# Patient Record
Sex: Male | Born: 2000 | Race: Black or African American | Hispanic: No | Marital: Single | Smoking: Current some day smoker
Health system: Southern US, Community
[De-identification: ages and names within clinical notes are randomized; demographics above are authoritative.]

## PROBLEM LIST (undated history)

## (undated) ENCOUNTER — Ambulatory Visit (HOSPITAL_COMMUNITY): Admission: EM | Source: Home / Self Care

## (undated) DIAGNOSIS — G43909 Migraine, unspecified, not intractable, without status migrainosus: Secondary | ICD-10-CM

## (undated) DIAGNOSIS — J302 Other seasonal allergic rhinitis: Secondary | ICD-10-CM

## (undated) HISTORY — PX: WISDOM TOOTH EXTRACTION: SHX21

## (undated) HISTORY — PX: ANKLE SURGERY: SHX546

---
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
## 2020-01-26 IMAGING — DX DG ANKLE COMPLETE 3+V*R*
3 series · 3 of 3 positions shown · non-contrast
Comparison: Right foot 08/17/2018

CLINICAL DATA: Basketball injury.  Right ankle swelling.

EXAM:
RIGHT ANKLE - COMPLETE 3+ VIEW

[ankle ap]
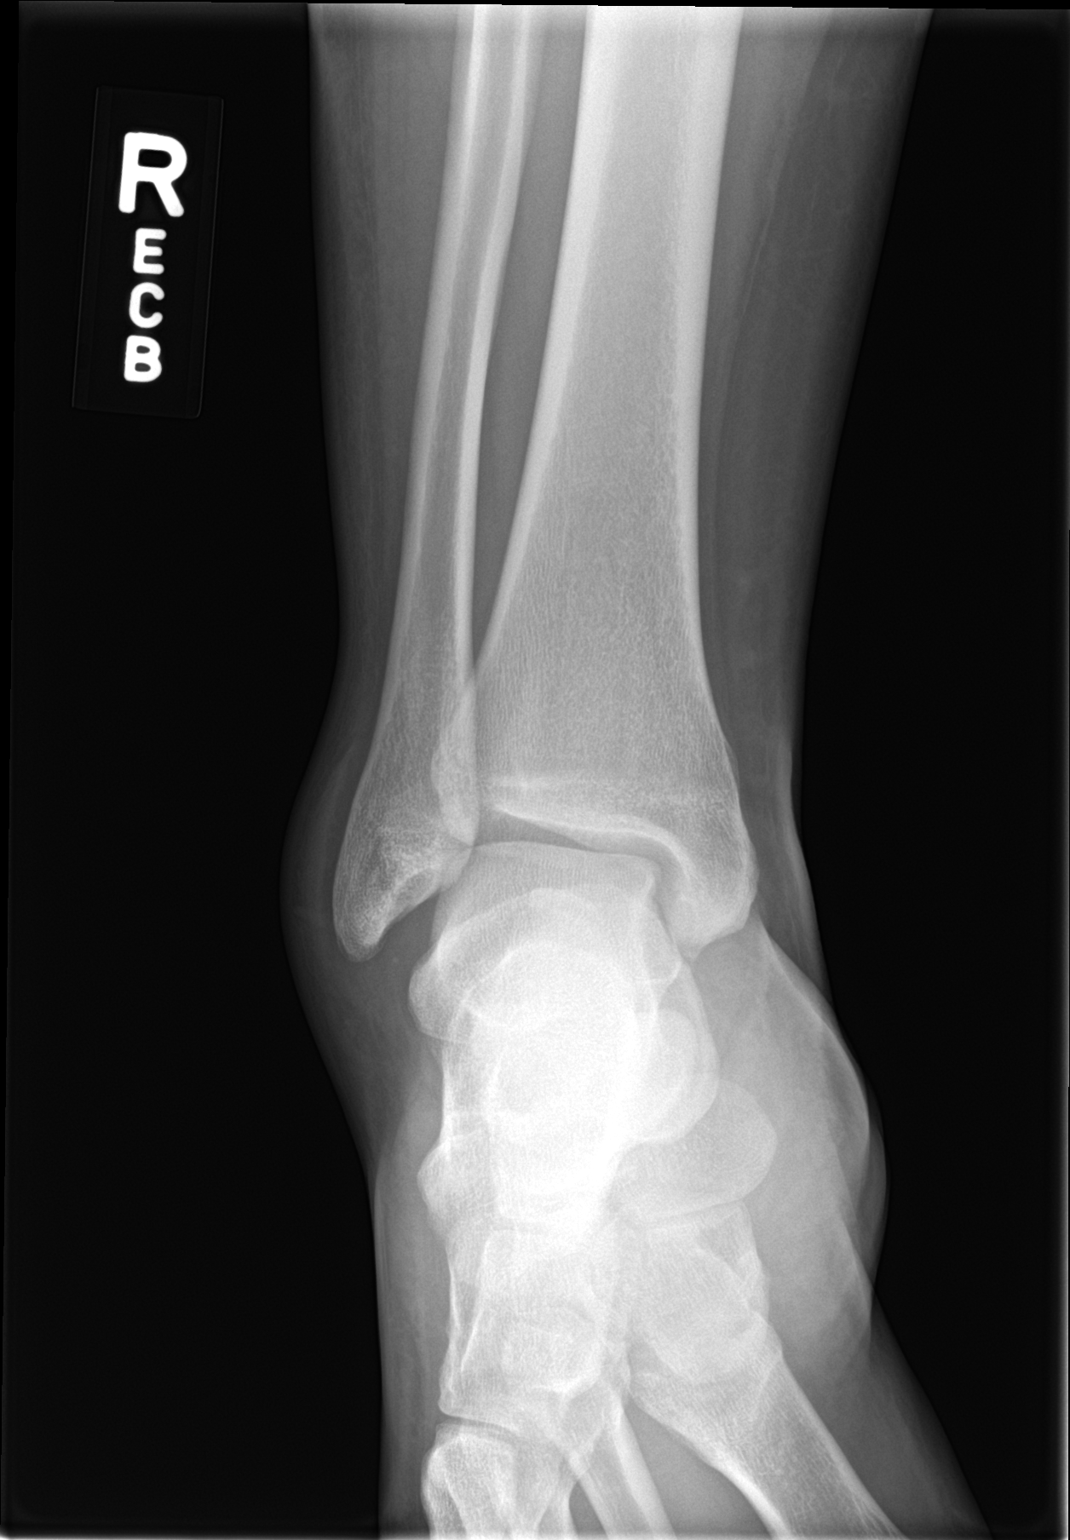

[ankle obl]
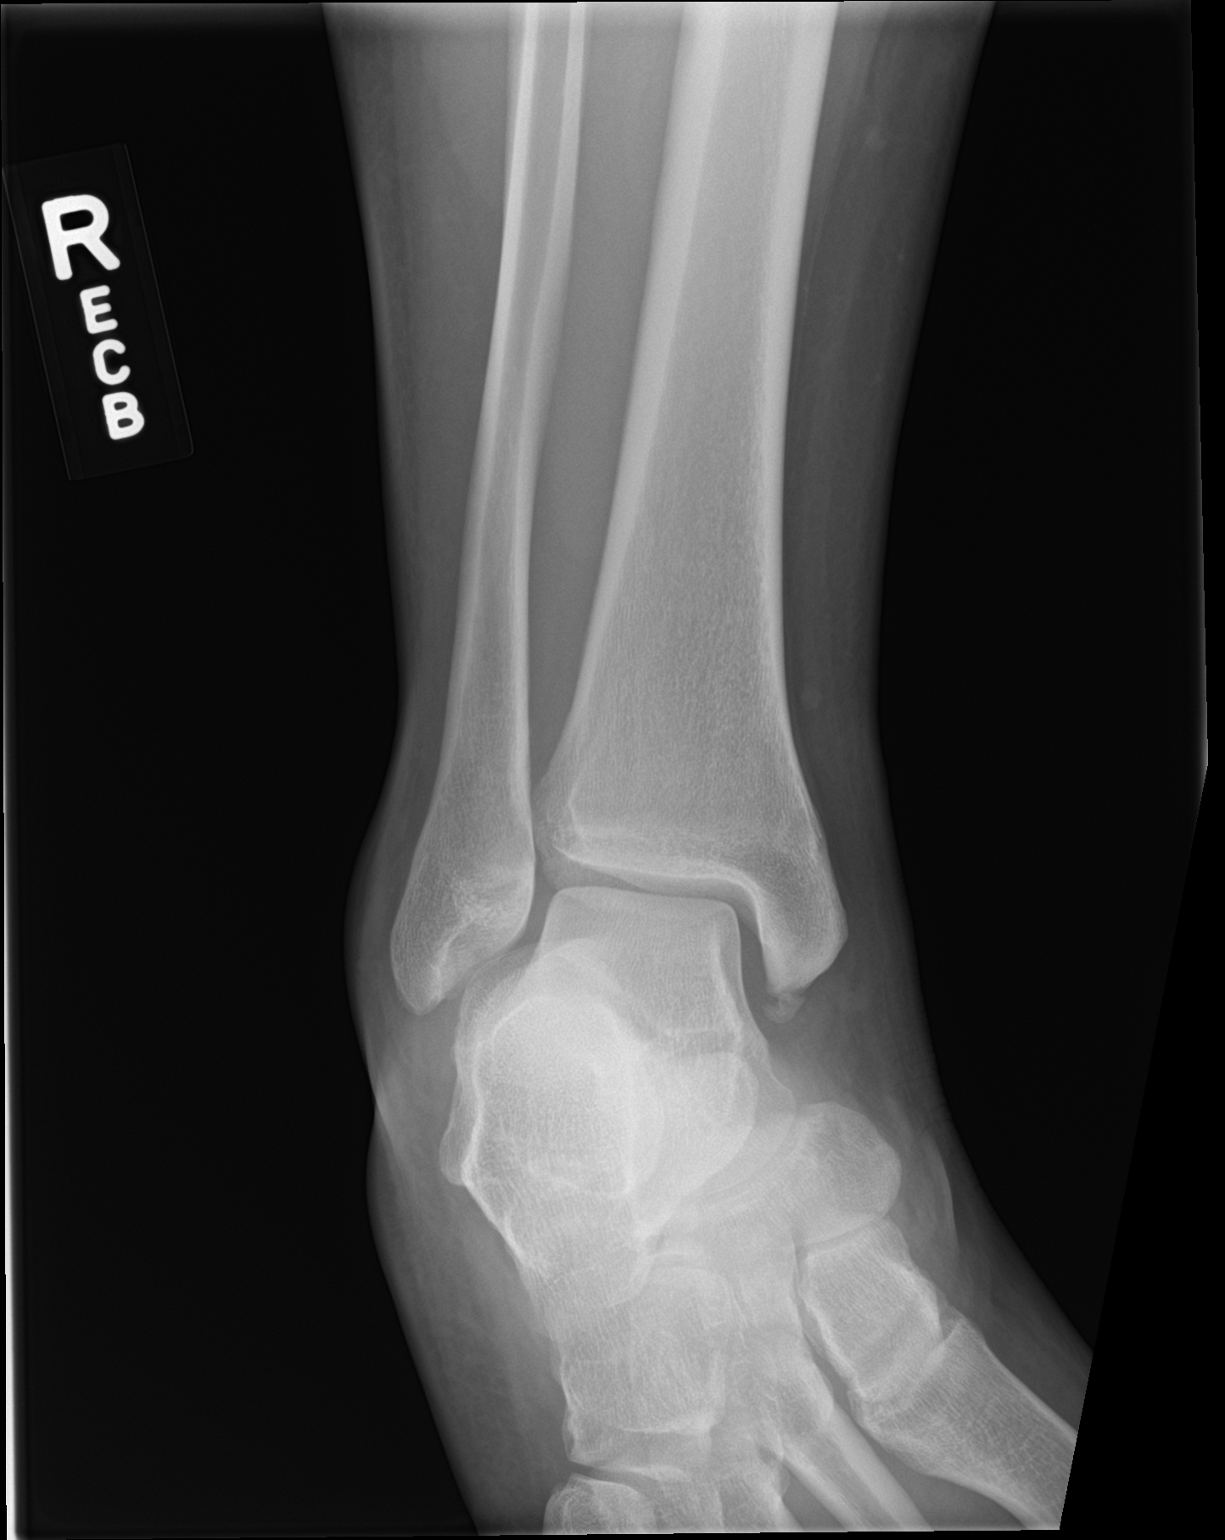

[ankle lat]
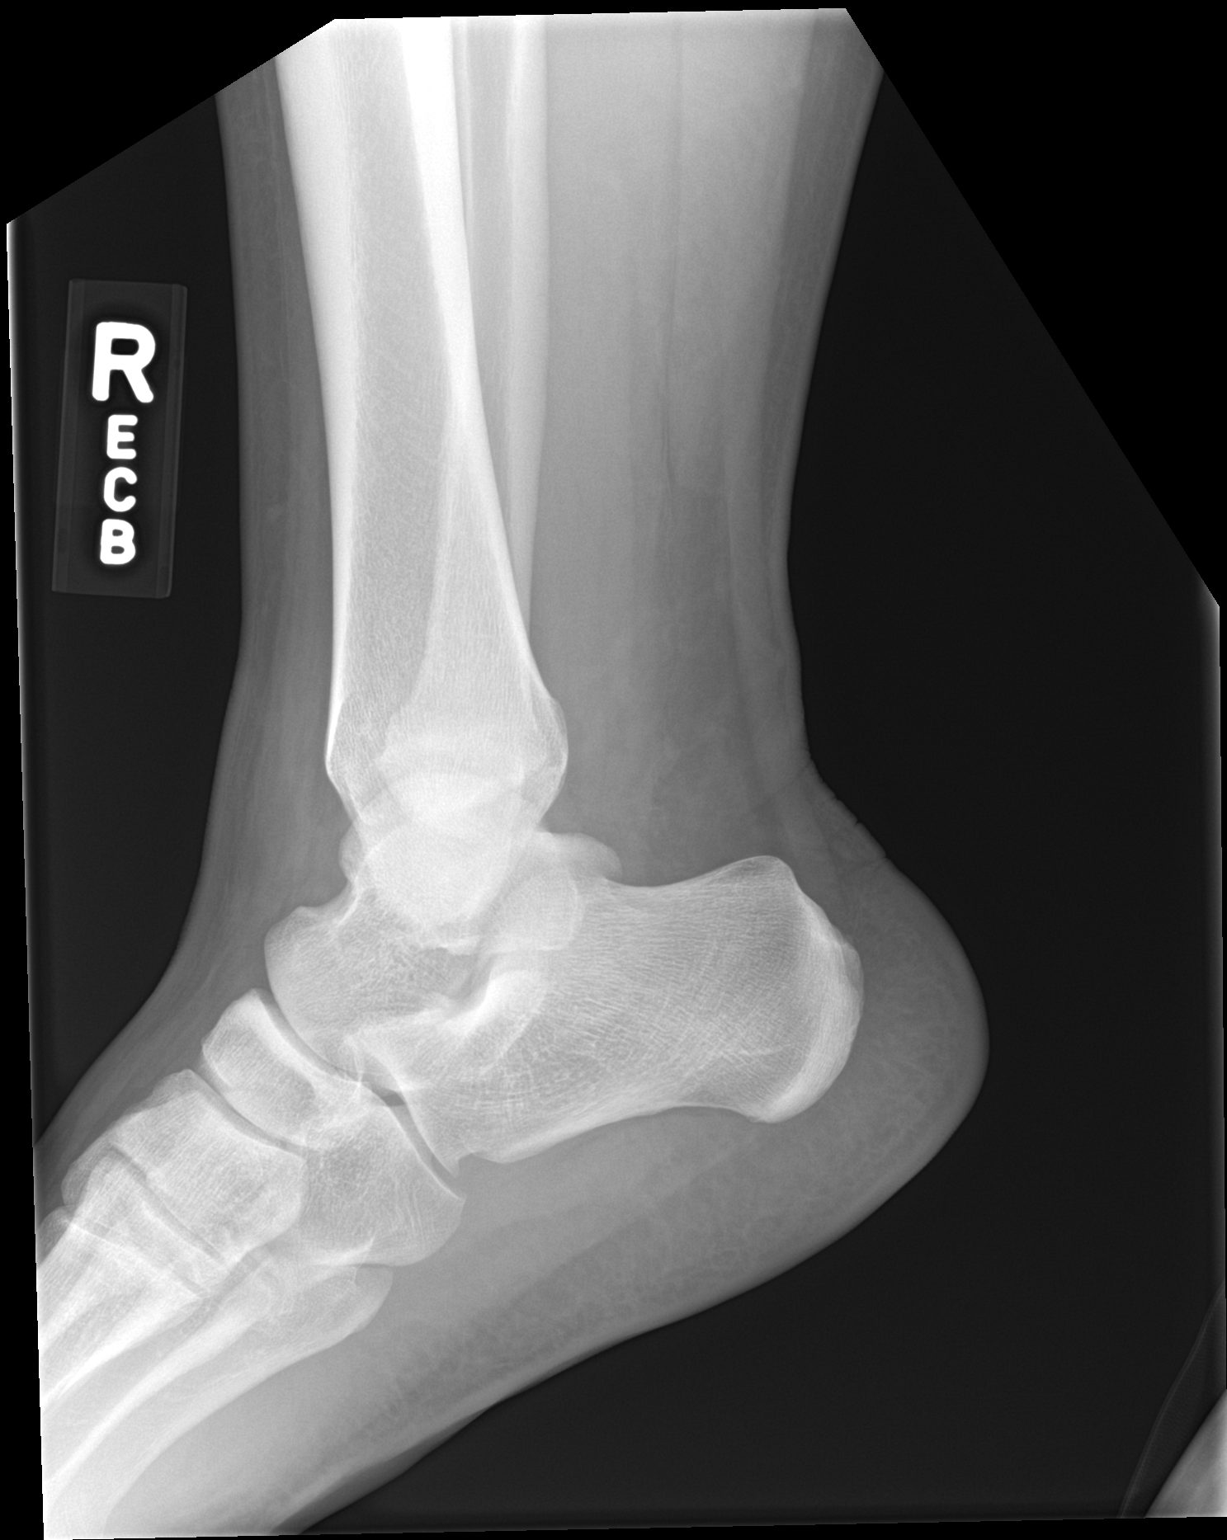

[3 of 3 positions shown; findings below may reference images not displayed]

FINDINGS: Right ankle is located. There is a bone fragment along the distal
aspect of the medial malleolus compatible with an avulsion injury.
There is soft tissue swelling in the right ankle.
IMPRESSION: Avulsion fracture involving the medial malleolus.

## 2020-03-12 ENCOUNTER — Emergency Department (HOSPITAL_COMMUNITY)
Admission: EM | Admit: 2020-03-12 | Discharge: 2020-03-13 | Disposition: A | Discharge status: Home / Self Care | Payer: Medicaid Other | Attending: Emergency Medicine | Admitting: Emergency Medicine

## 2020-03-12 ENCOUNTER — Emergency Department (HOSPITAL_COMMUNITY): Payer: Medicaid Other

## 2020-03-12 ENCOUNTER — Encounter (HOSPITAL_COMMUNITY): Payer: Self-pay

## 2020-03-12 DIAGNOSIS — R0602 Shortness of breath: Secondary | ICD-10-CM | POA: Insufficient documentation

## 2020-03-12 DIAGNOSIS — R61 Generalized hyperhidrosis: Secondary | ICD-10-CM | POA: Diagnosis not present

## 2020-03-12 DIAGNOSIS — Z5321 Procedure and treatment not carried out due to patient leaving prior to being seen by health care provider: Secondary | ICD-10-CM | POA: Insufficient documentation

## 2020-03-12 DIAGNOSIS — R0789 Other chest pain: Secondary | ICD-10-CM | POA: Diagnosis present

## 2020-03-12 LAB — CBC
HCT: 39.9 % (ref 39.0–52.0)
Hemoglobin: 13.4 g/dL (ref 13.0–17.0)
MCH: 28.8 pg (ref 26.0–34.0)
MCHC: 33.6 g/dL (ref 30.0–36.0)
MCV: 85.6 fL (ref 80.0–100.0)
Platelets: 215 10*3/uL (ref 150–400)
RBC: 4.66 MIL/uL (ref 4.22–5.81)
RDW: 13.1 % (ref 11.5–15.5)
WBC: 6.1 10*3/uL (ref 4.0–10.5)
nRBC: 0 % (ref 0.0–0.2)

## 2020-03-12 LAB — BASIC METABOLIC PANEL
Anion gap: 8 (ref 5–15)
BUN: 9 mg/dL (ref 6–20)
CO2: 28 mmol/L (ref 22–32)
Calcium: 9.5 mg/dL (ref 8.9–10.3)
Chloride: 103 mmol/L (ref 98–111)
Creatinine, Ser: 0.92 mg/dL (ref 0.61–1.24)
GFR calc Af Amer: >60 mL/min (ref >60)
GFR calc non Af Amer: >60 mL/min (ref >60)
Glucose, Bld: 96 mg/dL (ref 70–99)
Potassium: 4.2 mmol/L (ref 3.5–5.1)
Sodium: 139 mmol/L (ref 135–145)

## 2020-03-12 LAB — TROPONIN I (HIGH SENSITIVITY): Troponin I (High Sensitivity): 5 ng/L (ref <18)

## 2020-03-12 NOTE — ED Notes (Signed)
Pt di not want to wait.LWBS

## 2020-03-12 NOTE — ED Triage Notes (Signed)
Pt arrives POV for eval of episodic L sided chest pain described as squeezing and tight in nature. Endorses SOB and diaphoresis w/ pain. Pt reports + ETOH and MJ use today

## 2020-11-13 ENCOUNTER — Emergency Department (HOSPITAL_COMMUNITY): Payer: Medicaid Other

## 2020-11-13 ENCOUNTER — Encounter (HOSPITAL_COMMUNITY): Payer: Self-pay

## 2020-11-13 ENCOUNTER — Emergency Department (HOSPITAL_COMMUNITY)
Admission: EM | Admit: 2020-11-13 | Discharge: 2020-11-13 | Disposition: A | Discharge status: Home / Self Care | Payer: Medicaid Other | Attending: Emergency Medicine | Admitting: Emergency Medicine

## 2020-11-13 DIAGNOSIS — R079 Chest pain, unspecified: Secondary | ICD-10-CM | POA: Insufficient documentation

## 2020-11-13 DIAGNOSIS — Z5321 Procedure and treatment not carried out due to patient leaving prior to being seen by health care provider: Secondary | ICD-10-CM | POA: Insufficient documentation

## 2020-11-13 LAB — BASIC METABOLIC PANEL
Anion gap: 6 (ref 5–15)
BUN: 9 mg/dL (ref 6–20)
CO2: 27 mmol/L (ref 22–32)
Calcium: 9.8 mg/dL (ref 8.9–10.3)
Chloride: 104 mmol/L (ref 98–111)
Creatinine, Ser: 0.96 mg/dL (ref 0.61–1.24)
GFR, Estimated: >60 mL/min (ref >60)
Glucose, Bld: 97 mg/dL (ref 70–99)
Potassium: 4 mmol/L (ref 3.5–5.1)
Sodium: 137 mmol/L (ref 135–145)

## 2020-11-13 LAB — CBC
HCT: 45 % (ref 39.0–52.0)
Hemoglobin: 15.4 g/dL (ref 13.0–17.0)
MCH: 29.4 pg (ref 26.0–34.0)
MCHC: 34.2 g/dL (ref 30.0–36.0)
MCV: 85.9 fL (ref 80.0–100.0)
Platelets: 227 10*3/uL (ref 150–400)
RBC: 5.24 MIL/uL (ref 4.22–5.81)
RDW: 13.6 % (ref 11.5–15.5)
WBC: 4.1 10*3/uL (ref 4.0–10.5)
nRBC: 0 % (ref 0.0–0.2)

## 2020-11-13 LAB — TROPONIN I (HIGH SENSITIVITY): Troponin I (High Sensitivity): <2 ng/L (ref <18)

## 2020-11-13 NOTE — ED Notes (Signed)
Pt left without being seen by a provider. Pt said he will go to another hospital.

## 2020-11-13 NOTE — ED Triage Notes (Signed)
Pt reports cp x1 month.Pt reports he feels a tightness sensation.Pt reports he was seen for same x1 month ago and every was wdl.

## 2020-11-27 ENCOUNTER — Emergency Department (HOSPITAL_COMMUNITY): Payer: Medicaid Other

## 2020-11-27 ENCOUNTER — Emergency Department (HOSPITAL_COMMUNITY)
Admission: EM | Admit: 2020-11-27 | Discharge: 2020-11-27 | Disposition: A | Discharge status: Home / Self Care | Payer: Medicaid Other | Attending: Emergency Medicine | Admitting: Emergency Medicine

## 2020-11-27 ENCOUNTER — Other Ambulatory Visit: Payer: Self-pay

## 2020-11-27 ENCOUNTER — Encounter (HOSPITAL_COMMUNITY): Payer: Self-pay

## 2020-11-27 DIAGNOSIS — F1910 Other psychoactive substance abuse, uncomplicated: Secondary | ICD-10-CM | POA: Diagnosis not present

## 2020-11-27 DIAGNOSIS — R072 Precordial pain: Secondary | ICD-10-CM | POA: Diagnosis present

## 2020-11-27 LAB — CBC
HCT: 44.9 % (ref 39.0–52.0)
Hemoglobin: 15.1 g/dL (ref 13.0–17.0)
MCH: 28.7 pg (ref 26.0–34.0)
MCHC: 33.6 g/dL (ref 30.0–36.0)
MCV: 85.4 fL (ref 80.0–100.0)
Platelets: 262 10*3/uL (ref 150–400)
RBC: 5.26 MIL/uL (ref 4.22–5.81)
RDW: 13 % (ref 11.5–15.5)
WBC: 7.1 10*3/uL (ref 4.0–10.5)
nRBC: 0 % (ref 0.0–0.2)

## 2020-11-27 LAB — BASIC METABOLIC PANEL
Anion gap: 7 (ref 5–15)
BUN: 15 mg/dL (ref 6–20)
CO2: 28 mmol/L (ref 22–32)
Calcium: 9.4 mg/dL (ref 8.9–10.3)
Chloride: 101 mmol/L (ref 98–111)
Creatinine, Ser: 1.1 mg/dL (ref 0.61–1.24)
GFR, Estimated: >60 mL/min (ref >60)
Glucose, Bld: 102 mg/dL — ABNORMAL HIGH (ref 70–99)
Potassium: 3.7 mmol/L (ref 3.5–5.1)
Sodium: 136 mmol/L (ref 135–145)

## 2020-11-27 LAB — TROPONIN I (HIGH SENSITIVITY): Troponin I (High Sensitivity): 3 ng/L (ref <18)

## 2020-11-27 MED ORDER — LORAZEPAM 1 MG PO TABS
1.0000 mg | ORAL_TABLET | Freq: Once | ORAL | Status: AC
  Administered 2020-11-27: 1 mg via ORAL
  Filled 2020-11-27: qty 1

## 2020-11-27 NOTE — ED Notes (Signed)
Pt taken to xray 

## 2020-11-27 NOTE — ED Provider Notes (Signed)
[facility] EMERGENCY DEPARTMENT Provider Note   CSN: [id number] Arrival date & time: 11/27/20  6967     History Chief Complaint  Patient presents with  . Chest Pain    Evan Small. is a 20 y.o. male.  Reportedly has drank little bit tequila and smokes marijuana but then 850 mg delta 8 gummy.  Started to have an the sensation of his heart pounding in his chest.  Came here to get checked out to make sure his heart was okay.  No shortness of breath.  No nausea, vomiting, diarrhea, constipation or diaphoresis.    Chest Pain Pain location:  Substernal area      Past Medical History:  Diagnosis Date  . Migraines   . Seasonal allergies     There are no problems to display for this patient.   Past Surgical History:  Procedure Laterality Date  . WISDOM TOOTH EXTRACTION         Family History  Problem Relation Age of Onset  . Heart failure Mother     Social History   Tobacco Use  . Smoking status: Never Smoker  . Smokeless tobacco: Never Used  Substance Use Topics  . Alcohol use: Yes  . Drug use: Never    Home Medications Prior to Admission medications   Medication Sig Start Date End Date Taking? Authorizing Provider  albuterol (VENTOLIN HFA) 108 (90 Base) MCG/ACT inhaler Inhale 1 puff into the lungs every 4 (four) hours as needed for wheezing. 04/28/17  Yes [provider]    Allergies    Amoxicillin  Review of Systems   Review of Systems  Cardiovascular: Positive for chest pain.  All other systems reviewed and are negative.   Physical Exam Updated Vital Signs BP (!) 107/56   Pulse 62   Temp 98.7 F (37.1 C) (Oral)   Resp 13   SpO2 96%   Physical Exam Vitals and nursing note reviewed.  Constitutional:      Appearance: He is well-developed.  HENT:     Head: Normocephalic and atraumatic.     Mouth/Throat:     Mouth: Mucous membranes are moist.     Pharynx: Oropharynx is clear.  Eyes:     Pupils: Pupils are  equal, round, and reactive to light.  Cardiovascular:     Rate and Rhythm: Normal rate.  Pulmonary:     Effort: Pulmonary effort is normal. No respiratory distress.  Abdominal:     General: There is no distension.  Musculoskeletal:        General: Normal range of motion.     Cervical back: Normal range of motion.     Right lower leg: No tenderness. No edema.     Left lower leg: No tenderness. No edema.  Skin:    General: Skin is warm and dry.  Neurological:     General: No focal deficit present.     Mental Status: He is alert.     ED Results / Procedures / Treatments   Labs (all labs ordered are listed, but only abnormal results are displayed) Labs Reviewed  BASIC METABOLIC PANEL - Abnormal; Notable for the following components:      Result Value   Glucose, Bld 102 (*)    All other components within normal limits  CBC  TROPONIN I (HIGH SENSITIVITY)  TROPONIN I (HIGH SENSITIVITY)    EKG EKG Interpretation  Date/Time:  Thursday November 27 2020 03:41:10 EDT Ventricular Rate:  100 PR Interval:  122 QRS Duration: 90 QT Interval:  346 QTC Calculation: 446 R Axis:   70 Text Interpretation: Normal sinus rhythm Nonspecific ST and T wave abnormality Abnormal ECG Confirmed by Marily Memos [phone]) on 11/27/2020 4:05:19 AM   Radiology DG Chest 2 View  Result Date: 11/27/2020 CLINICAL DATA:  Chest pain EXAM: CHEST - 2 VIEW COMPARISON:  11/13/2020 FINDINGS: The heart size and mediastinal contours are within normal limits. Both lungs are clear. The visualized skeletal structures are unremarkable. IMPRESSION: No active cardiopulmonary disease. Electronically Signed   By: Helyn Numbers MD   On: 11/27/2020 04:04    Procedures Procedures   Medications Ordered in ED Medications  LORazepam (ATIVAN) tablet 1 mg (1 mg Oral Given 11/27/20 0518)    ED Course  I have reviewed the triage vital signs and the nursing notes.  Pertinent labs & imaging results that were available during  my care of the patient were reviewed by me and considered in my medical decision making (see chart for details).    MDM Rules/Calculators/A&P                          Adverse effect. Doubt overdose. Doubt pericarditis, PE or other significant abnormality. Ativan improved symptoms. Likely symptoms related to edible ingestion. Stable for discharge with sober individual.   Final Clinical Impression(s) / ED Diagnoses Final diagnoses:  Substance abuse [facility])    Rx / DC Orders ED Discharge Orders    None       Oland Arquette, Barbara Cower, MD 11/29/20 [phone]

## 2020-11-27 NOTE — ED Triage Notes (Signed)
Pt is here today due to chest pain.Pt report sudden onset since 1am. Pt reports around that time he also ate an "edible". Pt reports now he is having 9/10 chest pain / pounding sensation.Pt reports he feel like he is having "heart palpations". Pt is a&ox4 during triage.

## 2021-01-29 ENCOUNTER — Encounter (HOSPITAL_COMMUNITY): Payer: Self-pay | Admitting: Emergency Medicine

## 2021-01-29 ENCOUNTER — Emergency Department (HOSPITAL_COMMUNITY)
Admission: EM | Admit: 2021-01-29 | Discharge: 2021-01-30 | Disposition: A | Discharge status: Home / Self Care | Payer: Medicaid Other | Attending: Emergency Medicine | Admitting: Emergency Medicine

## 2021-01-29 ENCOUNTER — Emergency Department (HOSPITAL_COMMUNITY): Payer: Medicaid Other

## 2021-01-29 ENCOUNTER — Other Ambulatory Visit: Payer: Self-pay

## 2021-01-29 DIAGNOSIS — F329 Major depressive disorder, single episode, unspecified: Secondary | ICD-10-CM | POA: Diagnosis not present

## 2021-01-29 DIAGNOSIS — F17210 Nicotine dependence, cigarettes, uncomplicated: Secondary | ICD-10-CM | POA: Insufficient documentation

## 2021-01-29 DIAGNOSIS — R45851 Suicidal ideations: Secondary | ICD-10-CM | POA: Diagnosis not present

## 2021-01-29 DIAGNOSIS — R0789 Other chest pain: Secondary | ICD-10-CM | POA: Insufficient documentation

## 2021-01-29 DIAGNOSIS — Z20822 Contact with and (suspected) exposure to covid-19: Secondary | ICD-10-CM | POA: Diagnosis not present

## 2021-01-29 LAB — CBC WITH DIFFERENTIAL/PLATELET
Abs Immature Granulocytes: 0.01 10*3/uL (ref 0.00–0.07)
Basophils Absolute: 0 10*3/uL (ref 0.0–0.1)
Basophils Relative: 1 %
Eosinophils Absolute: 0.1 10*3/uL (ref 0.0–0.5)
Eosinophils Relative: 2 %
HCT: 40.3 % (ref 39.0–52.0)
Hemoglobin: 13.5 g/dL (ref 13.0–17.0)
Immature Granulocytes: 0 %
Lymphocytes Relative: 37 %
Lymphs Abs: 1.8 10*3/uL (ref 0.7–4.0)
MCH: 29.1 pg (ref 26.0–34.0)
MCHC: 33.5 g/dL (ref 30.0–36.0)
MCV: 86.9 fL (ref 80.0–100.0)
Monocytes Absolute: 0.4 10*3/uL (ref 0.1–1.0)
Monocytes Relative: 8 %
Neutro Abs: 2.5 10*3/uL (ref 1.7–7.7)
Neutrophils Relative %: 52 %
Platelets: 253 10*3/uL (ref 150–400)
RBC: 4.64 MIL/uL (ref 4.22–5.81)
RDW: 13.2 % (ref 11.5–15.5)
WBC: 4.8 10*3/uL (ref 4.0–10.5)
nRBC: 0 % (ref 0.0–0.2)

## 2021-01-29 LAB — RAPID URINE DRUG SCREEN, HOSP PERFORMED
Amphetamines: NOT DETECTED
Barbiturates: NOT DETECTED
Benzodiazepines: NOT DETECTED
Cocaine: NOT DETECTED
Opiates: NOT DETECTED
Tetrahydrocannabinol: POSITIVE — AB

## 2021-01-29 LAB — COMPREHENSIVE METABOLIC PANEL
ALT: 11 U/L (ref 0–44)
AST: 15 U/L (ref 15–41)
Albumin: 3.6 g/dL (ref 3.5–5.0)
Alkaline Phosphatase: 60 U/L (ref 38–126)
Anion gap: 6 (ref 5–15)
BUN: 6 mg/dL (ref 6–20)
CO2: 28 mmol/L (ref 22–32)
Calcium: 8.8 mg/dL — ABNORMAL LOW (ref 8.9–10.3)
Chloride: 104 mmol/L (ref 98–111)
Creatinine, Ser: 0.85 mg/dL (ref 0.61–1.24)
GFR, Estimated: >60 mL/min (ref >60)
Glucose, Bld: 92 mg/dL (ref 70–99)
Potassium: 3.7 mmol/L (ref 3.5–5.1)
Sodium: 138 mmol/L (ref 135–145)
Total Bilirubin: 1 mg/dL (ref 0.3–1.2)
Total Protein: 5.9 g/dL — ABNORMAL LOW (ref 6.5–8.1)

## 2021-01-29 LAB — ACETAMINOPHEN LEVEL: Acetaminophen (Tylenol), Serum: <10 ug/mL — ABNORMAL LOW (ref 10–30)

## 2021-01-29 LAB — TROPONIN I (HIGH SENSITIVITY)
Troponin I (High Sensitivity): 3 ng/L (ref <18)
Troponin I (High Sensitivity): 3 ng/L (ref <18)

## 2021-01-29 LAB — RESP PANEL BY RT-PCR (FLU A&B, COVID) ARPGX2
Influenza A by PCR: NEGATIVE
Influenza B by PCR: NEGATIVE
SARS Coronavirus 2 by RT PCR: NEGATIVE

## 2021-01-29 LAB — SALICYLATE LEVEL: Salicylate Lvl: <7 mg/dL — ABNORMAL LOW (ref 7.0–30.0)

## 2021-01-29 LAB — ETHANOL: Alcohol, Ethyl (B): 26 mg/dL — ABNORMAL HIGH (ref <10)

## 2021-01-29 NOTE — ED Notes (Signed)
[phone] is moms number her name is [place]

## 2021-01-29 NOTE — ED Provider Notes (Addendum)
[facility] EMERGENCY DEPARTMENT Provider Note   CSN: [id number] Arrival date & time: 01/29/21  1837     History Chief Complaint  Patient presents with  . Suicidal  . Chest Pain    Evan Small. is a 20 y.o. male.  Pt presents to the ED today with cp and feeling depressed.  He initially felt suicidal, but does not feel that way now.  He said he has been very stressed out due to his financial situation.  He denies hi or hallucinations.        Past Medical History:  Diagnosis Date  . Migraines   . Seasonal allergies     There are no problems to display for this patient.   Past Surgical History:  Procedure Laterality Date  . ANKLE SURGERY    . WISDOM TOOTH EXTRACTION         Family History  Problem Relation Age of Onset  . Heart failure Mother     Social History   Tobacco Use  . Smoking status: Current Some Day Smoker    Types: Cigarettes  . Smokeless tobacco: Never Used  . Tobacco comment: black n mild occ  Substance Use Topics  . Alcohol use: Yes    Comment: occ  . Drug use: Yes    Types: Marijuana    Home Medications Prior to Admission medications   Medication Sig Start Date End Date Taking? Authorizing Provider  albuterol (VENTOLIN HFA) 108 (90 Base) MCG/ACT inhaler Inhale 1 puff into the lungs every 4 (four) hours as needed for wheezing. 04/28/17   [provider]    Allergies    Amoxicillin  Review of Systems   Review of Systems  Cardiovascular: Positive for chest pain.  Psychiatric/Behavioral: Positive for suicidal ideas.  All other systems reviewed and are negative.   Physical Exam Updated Vital Signs BP (!) 105/58 (BP Location: Right Arm)   Pulse 66   Temp 99.2 F (37.3 C)   Resp 16   Ht 5\' 6"  (1.676 m)   Wt 78 kg   SpO2 100%   BMI 27.76 kg/m   Physical Exam Vitals and nursing note reviewed.  Constitutional:      Appearance: He is well-developed.  HENT:     Head: Normocephalic and  atraumatic.  Eyes:     Extraocular Movements: Extraocular movements intact.     Pupils: Pupils are equal, round, and reactive to light.  Cardiovascular:     Rate and Rhythm: Normal rate and regular rhythm.     Heart sounds: Normal heart sounds.  Pulmonary:     Effort: Pulmonary effort is normal.     Breath sounds: Normal breath sounds.  Abdominal:     General: Bowel sounds are normal.     Palpations: Abdomen is soft.  Musculoskeletal:        General: Normal range of motion.     Cervical back: Normal range of motion and neck supple.  Skin:    General: Skin is warm.     Capillary Refill: Capillary refill takes less than 2 seconds.  Neurological:     General: No focal deficit present.     Mental Status: He is alert and oriented to person, place, and time.  Psychiatric:        Mood and Affect: Mood normal.        Behavior: Behavior normal.     Comments: Some thoughts of suicide, but does not feel like he would do it.  ED Results / Procedures / Treatments   Labs (all labs ordered are listed, but only abnormal results are displayed) Labs Reviewed  COMPREHENSIVE METABOLIC PANEL - Abnormal; Notable for the following components:      Result Value   Calcium 8.8 (*)    Total Protein 5.9 (*)    All other components within normal limits  ETHANOL - Abnormal; Notable for the following components:   Alcohol, Ethyl (B) 26 (*)    All other components within normal limits  RAPID URINE DRUG SCREEN, HOSP PERFORMED - Abnormal; Notable for the following components:   Tetrahydrocannabinol POSITIVE (*)    All other components within normal limits  ACETAMINOPHEN LEVEL - Abnormal; Notable for the following components:   Acetaminophen (Tylenol), Serum <10 (*)    All other components within normal limits  SALICYLATE LEVEL - Abnormal; Notable for the following components:   Salicylate Lvl <7.0 (*)    All other components within normal limits  RESP PANEL BY RT-PCR (FLU A&B, COVID) ARPGX2  CBC  WITH DIFFERENTIAL/PLATELET  TROPONIN I (HIGH SENSITIVITY)  TROPONIN I (HIGH SENSITIVITY)    EKG EKG Interpretation  Date/Time:  Thursday January 29 2021 18:50:20 EDT Ventricular Rate:  68 PR Interval:  134 QRS Duration: 88 QT Interval:  372 QTC Calculation: 395 R Axis:   31 Text Interpretation: Sinus rhythm with marked sinus arrhythmia Nonspecific ST abnormality Abnormal ECG No significant change since last tracing Confirmed by Jacalyn Lefevre [phone]) on 01/29/2021 7:52:55 PM   Radiology DG Chest 2 View  Result Date: 01/29/2021 CLINICAL DATA:  Chest pain and shortness of breath. EXAM: CHEST - 2 VIEW COMPARISON:  11/27/2020 FINDINGS: The cardiomediastinal contours are normal. The lungs are clear. Pulmonary vasculature is normal. No consolidation, pleural effusion, or pneumothorax. No acute osseous abnormalities are seen. IMPRESSION: Negative radiographs of the chest. Electronically Signed   By: Narda Rutherford M.D.   On: 01/29/2021 19:55    Procedures Procedures   Medications Ordered in ED Medications - No data to display  ED Course  I have reviewed the triage vital signs and the nursing notes.  Pertinent labs & imaging results that were available during my care of the patient were reviewed by me and considered in my medical decision making (see chart for details).    MDM Rules/Calculators/A&P                           Pt is medically clear for TTS eval.    Pt d/w the TTS counselor.  He is accepted to [facility].  Pt does not want to go.  He said he has to be at work in the morning and missing work would cause more stress and financial difficulty.    Pt denies that he would commit suicide.  At this point, I don't want to do an IVC.  He does know that he is welcome back any time.  Final Clinical Impression(s) / ED Diagnoses Final diagnoses:  Atypical chest pain  Suicidal ideation    Rx / DC Orders ED Discharge Orders    None       Jacalyn Lefevre, MD 01/29/21 2102     Jacalyn Lefevre, MD 01/29/21 2345

## 2021-01-29 NOTE — BH Assessment (Signed)
Clinician spoke to Laurance Flatten, RN that she's ready to complete the pt's TTS assessment, once he's in a private room. Per RN she's in another's pt's room hanging blood and will secure chat her once the pt is ready to be assessed.     Redmond Pulling, MS, [facility], [facility] Triage Specialist [phone]

## 2021-01-29 NOTE — ED Notes (Signed)
TTS in use at this time. 

## 2021-01-29 NOTE — ED Notes (Signed)
Evan Small, [phone] or [phone] would like to speak to pt

## 2021-01-29 NOTE — ED Provider Notes (Signed)
Emergency Medicine Provider Triage Evaluation Note  Evan Small , a 20 y.o. male  was evaluated in triage.  Pt complains of central chest pain for the past hour as well as shortness of breath.  Also endorsing suicidal ideation.  Denies any HI, AVH.  He does have a history of asthma.  Review of Systems  Positive: Chest pain, shortness of breath, suicidal ideation Negative: HI, AVH  Physical Exam  BP 137/62   Pulse 68   Temp 99.2 F (37.3 C)   Resp 14   SpO2 100%  Gen:   Awake, no distress Resp:  Normal effort MSK:   Moves extremities without difficulty Other:  Suicidal ideation  Medical Decision Making  Medically screening exam initiated at 6:57 PM.  Appropriate orders placed.  Evan Small. was informed that the remainder of the evaluation will be completed by another provider, this initial triage assessment does not replace that evaluation, and the importance of remaining in the ED until their evaluation is complete.  Will order lab work, troponin, EKG medical screening   Dietrich Pates, Cordelia Poche 01/29/21 Jiles Harold, MD 01/30/21 [phone]

## 2021-01-29 NOTE — ED Triage Notes (Signed)
Per ems, pt coming from work. Pt reports left side non-radiating chest pain that started around 5:45pm today. Pt reports having this pain multiple times before that brings on anxiety along with SI. Hx of depression. Pt was telling his girlfriend he had a plan to walk into traffic or OD on her anxiety medication. Pt reports his cp gets worse with his depression. EMS VSS. Denies HI, hallucinations.

## 2021-01-29 NOTE — ED Notes (Signed)
Pt changed into purple scrubs. Security notifed for wanding. Staffing notifed for sitter. Pt's belongings in triage, not inventoried at this time.  

## 2021-01-30 NOTE — BH Assessment (Signed)
Comprehensive Clinical Assessment (CCA) Note  01/30/2021 Evan Small. [id number]   Disposition: Liborio Nixon, NP recommends pt to be observed and reassessed by psychiatry, pt has been accepted to [facility]. Disposition discussed with Dr. Particia Nearing and Mitchel Honour, RN. Dr. Particia Nearing to talk to pt if he's willing to go to [facility] voluntary. Per Shawna Orleans, RN Dr. Particia Nearing to discharge the pt.   Flowsheet Row ED from 01/29/2021 in [facility] EMERGENCY DEPARTMENT ED from 11/27/2020 in [facility] EMERGENCY DEPARTMENT  C-SSRS RISK CATEGORY High Risk No Risk     The patient demonstrates the following risk factors for suicide: Chronic risk factors for suicide include: psychiatric disorder of Depression. Acute risk factors for suicide include: Pt reports, SI with plan. Protective factors for this patient include: positive social support. Considering these factors, the overall suicide risk at this point appears to be high. Patient is appropriate for outpatient follow up.  Evan Small is a 20 year old male who presents voluntary and unaccompanied to [facility]. Clinician asked the pt, "what brought you to the hospital?" Pt reports, he was having chest pains, called 911 and was brought to the hospital. Pt reports, his girlfriend suggested he discuss his depressed moments. Pt reports, six years ago his great-grandmother passed away; three years ago his grandmother passed away; his mother had cardiac arrest she's currently in a nursing home (his mother will ask him the same question five minutes later and is surprised by the answer) and his father is in a wheelchair. Pt reported, thinking about his family and not having a job triggered his depression and suicidal thoughts. Pt reports, he had a plan to walk-in traffic or overdose. Clinician asked the pt if he had intention of acting on his thoughts, pt replied, "I don't know," he went in a room and talked to his girlfriend. Pt  denies, SI, HI, AVH, self-injurious behaviors and access to weapons.   Pt reports, smoking half a blunt on Wednesday. Pt reports he'll have a whole blunt and take a couple of hits. Pt reports he's slowed down and is now smoking every other day or every three days. Pt's UDS is positive for marijuana. Pt's taking a couple of shots today. Pt's BAL was 26 at 1858. Pt reports, drinking every other weekend. Pt denies, being linked to OPT resources (medication management and/or counseling.) Pt reports, on Tuesday his father told him to find a therapist. Pt denies, previous inpatient admissions.   Pt presents alert in scrubs with normal speech. Pt's mood, affect was pleasant. Pt's thought content was appropriate to mood and circumstances. Pt's insight, judgement was fair. 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  Diagnosis: Unspecified Depressive Disorder.  *Pt declined for clinician to contact supports to obtain collateral information.*  Chief Complaint:  Chief Complaint  Patient presents with  . Suicidal  . Chest Pain   Visit Diagnosis:     CCA Screening, Triage and Referral (STR)  Patient Reported Information How did you hear about [place]? Other (Comment)  Referral name: EMS  Referral phone number: 911   Whom do you see for routine medical problems? Primary Care (Per chart.)  Practice/Facility Name: [facility], [place].  Practice/Facility Phone Number: [phone]  Name of Contact: [facility], [place].  Contact Number: [phone]  Contact Fax Number: [phone]  Prescriber Name: [facility]  [facility], Kathryne Sharper.  Prescriber Address (if known): No data recorded  What Is the Reason for Your Visit/Call Today? Chest pains, depression and suicide ideations with plan.  How Long Has This Been Causing You  Problems? 1 wk - 1 month  What Do You Feel Would Help You the Most Today? Treatment for Depression or other mood problem   Have You Recently Been in Any Inpatient Treatment (Hospital/Detox/Crisis Center/28-Day Program)? No data recorded Name/Location of Program/Hospital:No data recorded How Long Were You There? No data recorded When Were You Discharged? No data recorded  Have You Ever Received Services From [facility] Before? Yes  Who Do You See at [facility]? Pt has previous ED visits.   Have You Recently Had Any Thoughts About Hurting Yourself? Yes  Are You Planning to Commit Suicide/Harm Yourself At This time? No (Pt denies.)   Have you Recently Had Thoughts About Hurting Someone Karolee Ohs? No  Explanation: No data recorded  Have You Used Any Alcohol or Drugs in the Past 24 Hours? Yes  How Long Ago Did You Use Drugs or Alcohol? No data recorded What Did You Use and How Much? Pt reports, smoking marijuana on Wednesday and taking a couple shots today.   Do You Currently Have a Therapist/Psychiatrist? No  Name of Therapist/Psychiatrist: No data recorded  Have You Been Recently Discharged From Any Office Practice or Programs? No data recorded Explanation of Discharge From Practice/Program: No data recorded    CCA Screening Triage Referral Assessment Type of Contact: Tele-Assessment  Is this Initial or Reassessment? Initial Assessment  Date Telepsych consult ordered in [facility]:  01/29/2021  Time Telepsych consult ordered in [facility]:  2038   Patient Reported Information Reviewed? Yes  Patient Left Without Being Seen? No data recorded Reason for Not Completing Assessment: No data recorded  Collateral Involvement: Pt declined for clinician to contact supports to obtain addtional information.   Does Patient Have a Automotive engineer Guardian? No data recorded Name and Contact of Legal Guardian: No data recorded If Minor and Not Living with Parent(s), Who has Custody? No data  recorded Is CPS involved or ever been involved? No data recorded Is APS involved or ever been involved? No data recorded  Patient Determined To Be At Risk for Harm To Self or Others Based on Review of Patient Reported Information or Presenting Complaint? Yes, for Self-Harm  Method: No data recorded Availability of Means: No data recorded Intent: No data recorded Notification Required: No data recorded Additional Information for Danger to Others Potential: No data recorded Additional Comments for Danger to Others Potential: No data recorded Are There Guns or Other Weapons in Your Home? No data recorded Types of Guns/Weapons: No data recorded Are These Weapons Safely Secured?                            No data recorded Who Could Verify You Are Able To Have These Secured: No data recorded Do You Have any Outstanding Charges, Pending Court Dates, Parole/Probation? No data recorded Contacted To Inform of Risk of Harm To Self or Others: No data recorded  Location of Assessment: [facility] ED   Does Patient Present under Involuntary Commitment? No  IVC Papers Initial File Date: No data recorded  [place] of Residence: [place]   Patient Currently Receiving the Following Services: Not Receiving Services   Determination of Need: Urgent (48 hours)   Options For Referral: [facility]; Inpatient Hospitalization  CCA Biopsychosocial Intake/Chief Complaint:  Per EDP note: "Pt presents to the ED today with cp and feeling depressed. He initially felt suicidal, but does not feel that way now. He said he has been very stressed out due to his financial situation. He denies hi or hallucinations."  Current Symptoms/Problems: Depression, SI with plan.   Patient Reported Schizophrenia/Schizoaffective Diagnosis in Past: No data recorded  Strengths: Goal oriented, communication.  Preferences: No data recorded Abilities: No data recorded  Type of Services Patient Feels are Needed: Pt reports  he can contract for safety if discharged from [facility].   Initial Clinical Notes/Concerns: No data recorded  Mental Health Symptoms Depression:  Difficulty Concentrating; Sleep (too much or little); Worthlessness; Hopelessness (Isolation, guilt (sometimes).)   Duration of Depressive symptoms: No data recorded  Mania:  None   Anxiety:   Worrying   Psychosis:  None   Duration of Psychotic symptoms: No data recorded  Trauma:  None   Obsessions:  None   Compulsions:  None   Inattention:  Disorganized; Forgetful; Loses things   Hyperactivity/Impulsivity:  N/A   Oppositional/Defiant Behaviors:  None   Emotional Irregularity:  None   Other Mood/Personality Symptoms:  No data recorded   Mental Status Exam Appearance and self-care  Stature:  Average   Weight:  Average weight   Clothing:  -- (Pt in scrubs.)   Grooming:  Normal   Cosmetic use:  None   Posture/gait:  Normal   Motor activity:  Not Remarkable   Sensorium  Attention:  Normal   Concentration:  Normal   Orientation:  X5   Recall/memory:  Normal   Affect and Mood  Affect:  Other (Comment) (Pleasant.)   Mood:  Other (Comment) (Pleasant.)   Relating  Eye contact:  Normal   Facial expression:  Responsive   Attitude toward examiner:  Cooperative   Thought and Language  Speech flow: Normal   Thought content:  Appropriate to Mood and Circumstances   Preoccupation:  None   Hallucinations:  None   Organization:  No data recorded  [employer] of Knowledge:  Fair   Intelligence:  Average   Abstraction:  No data recorded  Judgement:  Fair   Reality Testing:  No data recorded  Insight:  Fair   Decision Making:  Impulsive   Social Functioning  Social Maturity:  Isolates (Per pt.)   Social Judgement:  No data recorded  Stress  Stressors:  Other (Comment) (Pt reports, thinking about his mother and not having a job (pt then said he has a job.))   Coping Ability:   Human resources officer Deficits:  Self-control; Decision making   Supports:  Other (Comment) (Per pt he has supports sometimes.)     Religion: Religion/Spirituality Are You A Religious Person?: No  Leisure/Recreation: Leisure / Recreation Do You Have Hobbies?: Yes Leisure and Hobbies: Making music, work.  Exercise/Diet: Exercise/Diet Do You Have Any Trouble Sleeping?: Yes Explanation of Sleeping Difficulties: Pt reports, sleeping four hours.   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 How long has patient been employed?: A month. Has patient ever been in the [employer]?: No  Education: Education Is Patient Currently Attending School?: No Last Grade Completed: 12 Name of High School: [employer]. Did You Graduate From [employer]?: Yes What Type of College Degree Do you Have?: Pt is planning to got to [employer] at [employer] on Saturday  at 0900. Did You Attend Graduate School?: No   CCA Family/Childhood History Family and Relationship History: Family history Marital status: Single Does patient have children?: No  Childhood History:  Childhood History By whom was/is the patient raised?: Mother,Father Additional childhood history information: Pt reported, he was raised by his mother and grandmother until he was in the seventh grade we went to live with his father. Pt still lives with his father. Does patient have siblings?: Yes Number of Siblings: 2 Did patient suffer any verbal/emotional/physical/sexual abuse as a child?: No Did patient suffer from severe childhood neglect?: No Has patient ever been sexually abused/assaulted/raped as an adolescent or adult?: No Was the patient ever a victim of a crime or a disaster?: No Witnessed domestic violence?: No Has patient been affected by domestic violence as an adult?:   (NA)  Child/Adolescent Assessment:     CCA Substance Use Alcohol/Drug Use: Alcohol / Drug Use Pain Medications: See MAR Prescriptions: See MAR Over the Counter: See MAR History of alcohol / drug use?: Yes Substance #1 Name of Substance 1: Marijuana. 1 - Age of First Use: UTA. 1 - Amount (size/oz): Pt reports, smoking half a blunt on Wednesday. Pt reports he'll have a whole blunt and take a couple of hits. Pt's UDS is positive for marijuana. 1 - Frequency: Pt reports he's slowed down and is now smoking every other day or every three days. 1 - Duration: Ongoing. 1 - Last Use / Amount: Wednesday. 1 - Method of Aquiring: UTA 1- Route of Use: Smoke. Substance #2 Name of Substance 2: Alcohol. 2 - Age of First Use: UTA 2 - Amount (size/oz): Pt's taking a couple of shots today. Pt's BAL was 26 at 1858. 2 - Frequency: Pt reports, drinking every other weekend. 2 - Duration: Ongoing 2 - Last Use / Amount: Today. 2 - Method of Aquiring: UTA 2 - Route of Substance Use: Oral.     ASAM's:  Six Dimensions of Multidimensional Assessment  Dimension 1:  Acute Intoxication and/or Withdrawal Potential:      Dimension 2:  Biomedical Conditions and Complications:      Dimension 3:  Emotional, Behavioral, or Cognitive Conditions and Complications:     Dimension 4:  Readiness to Change:     Dimension 5:  Relapse, Continued use, or Continued Problem Potential:     Dimension 6:  Recovery/Living Environment:     ASAM Severity Score:    ASAM Recommended Level of Treatment:     Substance use Disorder (SUD)    Recommendations for Services/Supports/Treatments: Recommendations for Services/Supports/Treatments Recommendations For Services/Supports/Treatments: Other (Comment) (Pt to be observed and reassessed by psychiatry. Pt has been accepted to [facility].)  DSM5 Diagnoses: There are no problems to display for this patient.   Referrals to Alternative Service(s): Referred to Alternative  Service(s):   Place:   Date:   Time:    Referred to Alternative Service(s):   Place:   Date:   Time:    Referred to Alternative Service(s):   Place:   Date:   Time:    Referred to Alternative Service(s):   Place:   Date:   Time:     Redmond Pulling, [facility]  Comprehensive Clinical Assessment (CCA) Screening, Triage and Referral Note  01/30/2021 Evan Small [id number]  Chief Complaint:  Chief Complaint  Patient presents with  . Suicidal  . Chest Pain   Visit Diagnosis:   Patient Reported Information How did you hear about [place]? Other (Comment)  Referral name: EMS   Referral phone number: 911  Whom do you see for routine medical problems? Primary Care (Per chart.)   Practice/Facility Name: [facility], [place].   Practice/Facility Phone Number: [phone]   Name of Contact: [facility], [place].   Contact Number: [phone]   Contact Fax Number: [phone]   Prescriber Name: [facility], [place].   Prescriber Address (if known): No data recorded What Is the Reason for Your Visit/Call Today? Chest pains, depression and suicide ideations with plan.  How Long Has This Been Causing You Problems? 1 wk - 1 month  Have You Recently Been in Any Inpatient Treatment (Hospital/Detox/Crisis Center/28-Day Program)? No data recorded  Name/Location of Program/Hospital:No data recorded  How Long Were You There? No data recorded  When Were You Discharged? No data recorded Have You Ever Received Services From [facility] Before? Yes   Who Do You See at [facility]? Pt has previous ED visits.  Have You Recently Had Any Thoughts About Hurting Yourself? Yes   Are You Planning to Commit Suicide/Harm Yourself At This time?  No (Pt denies.)  Have you Recently Had Thoughts About Hurting Someone Karolee Ohs? No   Explanation: No data recorded Have You Used Any Alcohol or Drugs in the Past 24 Hours? Yes   How  Long Ago Did You Use Drugs or Alcohol?  No data recorded  What Did You Use and How Much? Pt reports, smoking marijuana on Wednesday and taking a couple shots today.  What Do You Feel Would Help You the Most Today? Treatment for Depression or other mood problem  Do You Currently Have a Therapist/Psychiatrist? No   Name of Therapist/Psychiatrist: No data recorded  Have You Been Recently Discharged From Any Office Practice or Programs? No data recorded  Explanation of Discharge From Practice/Program:  No data recorded    CCA Screening Triage Referral Assessment Type of Contact: Tele-Assessment   Is this Initial or Reassessment? Initial Assessment   Date Telepsych consult ordered in [facility]:  01/29/2021   Time Telepsych consult ordered in [facility]:  2038  Patient Reported Information Reviewed? Yes   Patient Left Without Being Seen? No data recorded  Reason for Not Completing Assessment: No data recorded Collateral Involvement: Pt declined for clinician to contact supports to obtain addtional information.  Does Patient Have a Automotive engineer Guardian? No data recorded  Name and Contact of Legal Guardian:  No data recorded If Minor and Not Living with Parent(s), Who has Custody? No data recorded Is CPS involved or ever been involved? No data recorded Is APS involved or ever been involved? No data recorded Patient Determined To Be At Risk for Harm To Self or Others Based on Review of Patient Reported Information or Presenting Complaint? Yes, for Self-Harm   Method: No data recorded  Availability of Means: No data recorded  Intent: No data recorded  Notification Required: No data recorded  Additional Information for Danger to Others Potential:  No data recorded  Additional Comments for Danger to Others Potential:  No data recorded  Are There Guns or Other Weapons in Your Home?  No data recorded   Types of Guns/Weapons: No data recorded   Are These Weapons Safely Secured?                               No data recorded   Who Could Verify You Are Able To Have  These Secured:    No data recorded Do You Have any Outstanding Charges, Pending Court Dates, Parole/Probation? No data recorded Contacted To Inform of Risk of Harm To Self or Others: No data recorded [facility]  Does Patient Present under Involuntary Commitment? No   IVC Papers Initial File Date: No data recorded  [place]  Patient Currently Receiving the Following Services: Not Receiving Services   Determination of Need: Urgent (48 hours)   [facility]; Inpatient Hospitalization   Redmond Pullingreylese D Evan Small, [facility]       Redmond Pullingreylese D Khamari Sheehan, MS, [facility], [facility] Triage Specialist [phone]

## 2021-01-30 NOTE — ED Notes (Signed)
Discharge instructions discussed with pt. Pt verbalized understanding. Pt stable and ambulatory. No signature pad available. 

## 2021-10-07 ENCOUNTER — Ambulatory Visit: Payer: Medicaid Other | Attending: Physician Assistant | Admitting: Physical Therapy

## 2021-10-25 ENCOUNTER — Ambulatory Visit (HOSPITAL_COMMUNITY)
Admission: EM | Admit: 2021-10-25 | Discharge: 2021-10-26 | Disposition: A | Discharge status: Home / Self Care | Payer: Medicaid Other | Attending: Psychiatry | Admitting: Psychiatry

## 2021-10-25 DIAGNOSIS — F322 Major depressive disorder, single episode, severe without psychotic features: Secondary | ICD-10-CM | POA: Insufficient documentation

## 2021-10-25 DIAGNOSIS — F329 Major depressive disorder, single episode, unspecified: Secondary | ICD-10-CM | POA: Insufficient documentation

## 2021-10-25 DIAGNOSIS — Z560 Unemployment, unspecified: Secondary | ICD-10-CM | POA: Insufficient documentation

## 2021-10-25 DIAGNOSIS — Z20822 Contact with and (suspected) exposure to covid-19: Secondary | ICD-10-CM | POA: Insufficient documentation

## 2021-10-25 DIAGNOSIS — Z634 Disappearance and death of family member: Secondary | ICD-10-CM | POA: Diagnosis not present

## 2021-10-25 DIAGNOSIS — R4587 Impulsiveness: Secondary | ICD-10-CM | POA: Insufficient documentation

## 2021-10-25 DIAGNOSIS — Z113 Encounter for screening for infections with a predominantly sexual mode of transmission: Secondary | ICD-10-CM | POA: Insufficient documentation

## 2021-10-25 DIAGNOSIS — F1020 Alcohol dependence, uncomplicated: Secondary | ICD-10-CM | POA: Insufficient documentation

## 2021-10-25 DIAGNOSIS — R450 Nervousness: Secondary | ICD-10-CM | POA: Insufficient documentation

## 2021-10-25 DIAGNOSIS — Z630 Problems in relationship with spouse or partner: Secondary | ICD-10-CM | POA: Insufficient documentation

## 2021-10-25 DIAGNOSIS — F4323 Adjustment disorder with mixed anxiety and depressed mood: Secondary | ICD-10-CM | POA: Insufficient documentation

## 2021-10-25 DIAGNOSIS — R45851 Suicidal ideations: Secondary | ICD-10-CM | POA: Insufficient documentation

## 2021-10-25 DIAGNOSIS — R825 Elevated urine levels of drugs, medicaments and biological substances: Secondary | ICD-10-CM | POA: Insufficient documentation

## 2021-10-25 DIAGNOSIS — Y901 Blood alcohol level of 20-39 mg/100 ml: Secondary | ICD-10-CM | POA: Insufficient documentation

## 2021-10-25 DIAGNOSIS — F1290 Cannabis use, unspecified, uncomplicated: Secondary | ICD-10-CM | POA: Insufficient documentation

## 2021-10-25 DIAGNOSIS — Z79899 Other long term (current) drug therapy: Secondary | ICD-10-CM | POA: Insufficient documentation

## 2021-10-25 DIAGNOSIS — N4889 Other specified disorders of penis: Secondary | ICD-10-CM | POA: Insufficient documentation

## 2021-10-25 LAB — COMPREHENSIVE METABOLIC PANEL
ALT: 17 U/L (ref 0–44)
AST: 18 U/L (ref 15–41)
Albumin: 3.7 g/dL (ref 3.5–5.0)
Alkaline Phosphatase: 74 U/L (ref 38–126)
Anion gap: 9 (ref 5–15)
BUN: 7 mg/dL (ref 6–20)
CO2: 28 mmol/L (ref 22–32)
Calcium: 9.8 mg/dL (ref 8.9–10.3)
Chloride: 101 mmol/L (ref 98–111)
Creatinine, Ser: 0.9 mg/dL (ref 0.61–1.24)
GFR, Estimated: >60 mL/min (ref >60)
Glucose, Bld: 96 mg/dL (ref 70–99)
Potassium: 3.5 mmol/L (ref 3.5–5.1)
Sodium: 138 mmol/L (ref 135–145)
Total Bilirubin: 1.4 mg/dL — ABNORMAL HIGH (ref 0.3–1.2)
Total Protein: 6.4 g/dL — ABNORMAL LOW (ref 6.5–8.1)

## 2021-10-25 LAB — URINALYSIS, COMPLETE (UACMP) WITH MICROSCOPIC
Bacteria, UA: NONE SEEN
Bilirubin Urine: NEGATIVE
Glucose, UA: NEGATIVE mg/dL
Hgb urine dipstick: NEGATIVE
Ketones, ur: NEGATIVE mg/dL
Leukocytes,Ua: NEGATIVE
Nitrite: NEGATIVE
Protein, ur: NEGATIVE mg/dL
Specific Gravity, Urine: 1.024 (ref 1.005–1.030)
pH: 5 (ref 5.0–8.0)

## 2021-10-25 LAB — CBC WITH DIFFERENTIAL/PLATELET
Abs Immature Granulocytes: 0.01 10*3/uL (ref 0.00–0.07)
Basophils Absolute: 0 10*3/uL (ref 0.0–0.1)
Basophils Relative: 1 %
Eosinophils Absolute: 0.1 10*3/uL (ref 0.0–0.5)
Eosinophils Relative: 1 %
HCT: 39.2 % (ref 39.0–52.0)
Hemoglobin: 13.8 g/dL (ref 13.0–17.0)
Immature Granulocytes: 0 %
Lymphocytes Relative: 34 %
Lymphs Abs: 2.4 10*3/uL (ref 0.7–4.0)
MCH: 29.7 pg (ref 26.0–34.0)
MCHC: 35.2 g/dL (ref 30.0–36.0)
MCV: 84.3 fL (ref 80.0–100.0)
Monocytes Absolute: 0.7 10*3/uL (ref 0.1–1.0)
Monocytes Relative: 10 %
Neutro Abs: 3.7 10*3/uL (ref 1.7–7.7)
Neutrophils Relative %: 54 %
Platelets: 271 10*3/uL (ref 150–400)
RBC: 4.65 MIL/uL (ref 4.22–5.81)
RDW: 13 % (ref 11.5–15.5)
WBC: 6.9 10*3/uL (ref 4.0–10.5)
nRBC: 0 % (ref 0.0–0.2)

## 2021-10-25 LAB — POCT URINE DRUG SCREEN - MANUAL ENTRY (I-SCREEN)
POC Amphetamine UR: NOT DETECTED
POC Buprenorphine (BUP): NOT DETECTED
POC Cocaine UR: POSITIVE — AB
POC Marijuana UR: POSITIVE — AB
POC Methadone UR: NOT DETECTED
POC Methamphetamine UR: NOT DETECTED
POC Morphine: NOT DETECTED
POC Oxazepam (BZO): NOT DETECTED
POC Oxycodone UR: NOT DETECTED
POC Secobarbital (BAR): NOT DETECTED

## 2021-10-25 LAB — RESP PANEL BY RT-PCR (FLU A&B, COVID) ARPGX2
Influenza A by PCR: NEGATIVE
Influenza B by PCR: NEGATIVE
SARS Coronavirus 2 by RT PCR: NEGATIVE

## 2021-10-25 LAB — MAGNESIUM: Magnesium: 2.2 mg/dL (ref 1.7–2.4)

## 2021-10-25 LAB — HEMOGLOBIN A1C
Hgb A1c MFr Bld: 4.7 % — ABNORMAL LOW (ref 4.8–5.6)
Mean Plasma Glucose: 88.19 mg/dL

## 2021-10-25 LAB — LIPID PANEL
Cholesterol: 149 mg/dL (ref 0–200)
HDL: 77 mg/dL (ref >40)
LDL Cholesterol: 65 mg/dL (ref 0–99)
Total CHOL/HDL Ratio: 1.9 RATIO
Triglycerides: 36 mg/dL (ref <150)
VLDL: 7 mg/dL (ref 0–40)

## 2021-10-25 LAB — POC SARS CORONAVIRUS 2 AG: SARSCOV2ONAVIRUS 2 AG: NEGATIVE

## 2021-10-25 LAB — HIV ANTIBODY (ROUTINE TESTING W REFLEX): HIV Screen 4th Generation wRfx: NONREACTIVE

## 2021-10-25 LAB — ETHANOL: Alcohol, Ethyl (B): <10 mg/dL (ref <10)

## 2021-10-25 LAB — TSH: TSH: 0.714 u[IU]/mL (ref 0.350–4.500)

## 2021-10-25 LAB — POC SARS CORONAVIRUS 2 AG -  ED: SARS Coronavirus 2 Ag: NEGATIVE

## 2021-10-25 MED ORDER — TRAZODONE HCL 50 MG PO TABS: 50.0000 mg | ORAL_TABLET | Freq: Every evening | ORAL | Status: DC | PRN

## 2021-10-25 MED ORDER — ESCITALOPRAM OXALATE 10 MG PO TABS
10.0000 mg | ORAL_TABLET | Freq: Every day | ORAL | Status: DC
  Administered 2021-10-25 – 2021-10-26 (×2): 10 mg via ORAL
  Filled 2021-10-25: qty 7
  Filled 2021-10-25 (×2): qty 1

## 2021-10-25 MED ORDER — ACETAMINOPHEN 325 MG PO TABS: 650.0000 mg | ORAL_TABLET | Freq: Four times a day (QID) | ORAL | Status: DC | PRN

## 2021-10-25 MED ORDER — ALUM & MAG HYDROXIDE-SIMETH 200-200-20 MG/5ML PO SUSP: 30.0000 mL | ORAL | Status: DC | PRN

## 2021-10-25 MED ORDER — MAGNESIUM HYDROXIDE 400 MG/5ML PO SUSP: 30.0000 mL | Freq: Every day | ORAL | Status: DC | PRN

## 2021-10-25 MED ORDER — HYDROXYZINE HCL 25 MG PO TABS: 25.0000 mg | ORAL_TABLET | Freq: Three times a day (TID) | ORAL | Status: DC | PRN

## 2021-10-25 NOTE — ED Notes (Signed)
Pt admitted to continuous assessment endorsing passive SI, depression due to recent loss of his mother, losing his job and recent breakup with girlfriend. Flat affect. Unable to contract for safety if discharged to home. Oriented to unit and unit rules. Meal and drink offered. Will monitor for safety.

## 2021-10-25 NOTE — ED Provider Notes (Signed)
Behavioral Health Admission [facility] OBS)  Date: 10/25/21 Patient Name: Evan Small. MRN: [id number] Chief Complaint:  Chief Complaint  Patient presents with   Adjustment Disorder    Pt is a 21 year old male who presented to [facility] with depressive symptoms -- sadness, hopelessness   Depression    Pt is a 21 year old male who presented to [facility] with complaint of depression due to recent stressors -- mother died, girlfriend broke up with him, unemployed.      Diagnoses:  Final diagnoses:  Adjustment disorder with mixed anxiety and depressed mood    HPI: patient presented to [facility] as a walk in alone with complaints of increased depression, anxiety and SI.  Evan River., 21 y.o., male patient seen face to face by this provider, consulted with Dr. Viviano Simas; and chart reviewed on 10/25/21.  Patient reports a past psychiatric history of MDD and anxiety.  Reports he has been on medications in the past for depression but can not remember the name. Patient had sexual side effects and stopped taking medications.  He currently does not take any medications.  He currently lives with his father. Reports recent stressors/triggers as: His mother died in late 07-10-2021, he is unemployed, and his girlfriend broke up with him in late August 31, 2021.  He has no outpatient psychiatric services in place.  Reports he is trying to establish services with behavioral health services on the second floor.  States he occasionally uses alcohol and marijuana with last use last night.   During evaluation Evan River. is in sitting position in no acute distress.  He is alert/oriented x4 and cooperative.  He is fairly groomed and makes good eye contact.  His speech is at a moderate pace, tone, and volume.  He endorses increased anxiety and depression with feelings of isolation, hopelessness, worthlessness, decreased appetite and sleep.  He has a depressed affect.  Objectively he does not appear to be  responding to internal/external stimuli.  He denies paranoid or delusional thought content.  He denies AVH and HI.  He endorses passive suicidal ideations.  Reports he has passive thoughts of how he could kill himself but does not elaborate.  He currently does not have any intent, or plan.  He can not contract for safety at this time. He is logical and answers questions appropriately.  Patient expressed concerns due to having some swelling around the head of his penis.  He denies any pain while urinating.  He denies having any abnormal discharge.  He admits that he has unprotected sex.  Orders were placed for STD testing (see treatment plan below) and U/A.  Discussed admission to the continuous assessment unit for overnight observation.  Explained the milieu and expectations including lab work, COVID testing, and EKG.  Patient agreed  PHQ 2-9:  Flowsheet Row ED from 10/25/2021 in [facility]  Thoughts that you would be better off dead, or of hurting yourself in some way Several days  PHQ-9 Total Score 16       Flowsheet Row ED from 10/25/2021 in [facility] ED from 01/29/2021 in [facility] EMERGENCY DEPARTMENT ED from 11/27/2020 in [facility] EMERGENCY DEPARTMENT  C-SSRS RISK CATEGORY Moderate Risk High Risk No Risk        Total Time spent with patient: 30 minutes  Musculoskeletal  Strength & Muscle Tone: within normal limits Gait & Station:  normal Patient leans: N/A  Psychiatric Specialty Exam  Presentation General Appearance: Appropriate for Environment; Casual  Eye Contact:Good  Speech:Clear and Coherent; Normal Rate  Speech Volume:Normal  Handedness:Right   Mood and Affect  Mood:Anxious; Depressed; Hopeless  Affect:Congruent; Depressed   Thought Process  Thought Processes:Coherent  Descriptions of Associations:Intact  Orientation:Full (Time, Place and Person)  Thought  Content:Logical  Diagnosis of Schizophrenia or Schizoaffective disorder in past: No   Hallucinations:Hallucinations: None  Ideas of Reference:None  Suicidal Thoughts:Suicidal Thoughts: Yes, Passive SI Passive Intent and/or Plan: Without Intent; Without Plan; Without Means to Carry Out  Homicidal Thoughts:Homicidal Thoughts: No   Sensorium  Memory:Immediate Good; Recent Good; Remote Good  Judgment:Fair  Insight:Fair   Executive Functions  Concentration:Good  Attention Span:Good  Recall:Good  Fund of Knowledge:Good  Language:Good   Psychomotor Activity  Psychomotor Activity:Psychomotor Activity: Normal   Assets  Assets:Communication Skills; Desire for Improvement; Housing; Health and safety inspectorinancial Resources/Insurance; Leisure Time; Physical Health; Social Support   Sleep  Sleep:Sleep: Fair   Nutritional Assessment (For OBS and FBC admissions only) Has the patient had a weight loss or gain of 10 pounds or more in the last 3 months?: No Has the patient had a decrease in food intake/or appetite?: Yes Does the patient have dental problems?: No Does the patient have eating habits or behaviors that may be indicators of an eating disorder including binging or inducing vomiting?: No Has the patient recently lost weight without trying?: 0    Physical Exam Vitals and nursing note reviewed.  Constitutional:      General: He is not in acute distress.    Appearance: Normal appearance. He is well-developed.  HENT:     Head: Normocephalic and atraumatic.  Eyes:     General:        Right eye: No discharge.        Left eye: No discharge.     Conjunctiva/sclera: Conjunctivae normal.  Cardiovascular:     Rate and Rhythm: Normal rate and regular rhythm.     Heart sounds: No murmur heard. Pulmonary:     Effort: Pulmonary effort is normal. No respiratory distress.     Breath sounds: Normal breath sounds.  Abdominal:     Palpations: Abdomen is soft.     Tenderness: There is no  abdominal tenderness.  Musculoskeletal:        General: No swelling. Normal range of motion.     Cervical back: Normal range of motion and neck supple.  Skin:    General: Skin is warm and dry.     Capillary Refill: Capillary refill takes less than 2 seconds.     Coloration: Skin is not jaundiced or pale.  Neurological:     Mental Status: He is alert and oriented to person, place, and time.  Psychiatric:        Attention and Perception: Attention and perception normal.        Mood and Affect: Mood is anxious and depressed.        Speech: Speech normal.        Behavior: Behavior is withdrawn. Behavior is cooperative.        Thought Content: Thought content includes suicidal ideation. Thought content does not include suicidal plan.        Cognition and Memory: Cognition normal.        Judgment: Judgment is impulsive.   Review of Systems  Constitutional: Negative.   HENT: Negative.    Eyes: Negative.   Respiratory: Negative.    Cardiovascular:  Negative.   Musculoskeletal: Negative.   Skin: Negative.   Neurological: Negative.   Psychiatric/Behavioral:  Positive for depression and suicidal ideas. The patient is nervous/anxious.    Blood pressure 133/65, pulse (!) 102, temperature 98.4 F (36.9 C), temperature source Oral, resp. rate 18, height 5\' 6"  (1.676 m), weight 175 lb (79.4 kg), SpO2 100 %. Body mass index is 28.25 kg/m.  Past Psychiatric History: mdd and anxiety    Is the patient at risk to self? Yes  Has the patient been a risk to self in the past 6 months? No .    Has the patient been a risk to self within the distant past? No   Is the patient a risk to others? No   Has the patient been a risk to others in the past 6 months? No   Has the patient been a risk to others within the distant past? No   Past Medical History:  Past Medical History:  Diagnosis Date   Migraines    Seasonal allergies     Past Surgical History:  Procedure Laterality Date   ANKLE SURGERY      WISDOM TOOTH EXTRACTION      Family History:  Family History  Problem Relation Age of Onset   Heart failure Mother     Social History:  Social History   Socioeconomic History   Marital status: Single    Spouse name: Not on file   Number of children: Not on file   Years of education: Not on file   Highest education level: Not on file  Occupational History   Not on file  Tobacco Use   Smoking status: Some Days    Types: Cigarettes   Smokeless tobacco: Never   Tobacco comments:    black n mild occ  Substance and Sexual Activity   Alcohol use: Yes    Comment: occ   Drug use: Yes    Types: Marijuana   Sexual activity: Not on file  Other Topics Concern   Not on file  Social History Narrative   Not on file   Social Determinants of Health   Financial Resource Strain: Not on file  Food Insecurity: Not on file  Transportation Needs: Not on file  Physical Activity: Not on file  Stress: Not on file  Social Connections: Not on file  Intimate Partner Violence: Not on file    SDOH:  SDOH Screenings   Alcohol Screen: Not on file  Depression (PHQ2-9): Medium Risk   PHQ-2 Score: 16  Financial Resource Strain: Not on file  Food Insecurity: Not on file  Housing: Not on file  Physical Activity: Not on file  Social Connections: Not on file  Stress: Not on file  Tobacco Use: High Risk   Smoking Tobacco Use: Some Days   Smokeless Tobacco Use: Never   Passive Exposure: Not on file  Transportation Needs: Not on file    Last Labs:  No visits with results within 6 Month(s) from this visit.  Latest known visit with results is:  Admission on 01/29/2021, Discharged on 01/30/2021  Component Date Value Ref Range Status   SARS Coronavirus 2 by RT PCR 01/29/2021 NEGATIVE  NEGATIVE Final   Comment: (NOTE) SARS-CoV-2 target nucleic acids are NOT DETECTED.  The SARS-CoV-2 RNA is generally detectable in upper respiratory specimens during the acute phase of infection. The  lowest concentration of SARS-CoV-2 viral copies this assay can detect is 138 copies/mL. A negative result does not preclude SARS-Cov-2 infection  and should not be used as the sole basis for treatment or other patient management decisions. A negative result may occur with  improper specimen collection/handling, submission of specimen other than nasopharyngeal swab, presence of viral mutation(s) within the areas targeted by this assay, and inadequate number of viral copies(<138 copies/mL). A negative result must be combined with clinical observations, patient history, and epidemiological information. The expected result is Negative.  Fact Sheet for Patients:  BloggerCourse.com  Fact Sheet for Healthcare Providers:  SeriousBroker.it  This test is no                          t yet approved or cleared by the [place] FDA and  has been authorized for detection and/or diagnosis of SARS-CoV-2 by FDA under an Emergency Use Authorization (EUA). This EUA will remain  in effect (meaning this test can be used) for the duration of the COVID-19 declaration under Section 564(b)(1) of the Act, 21 U.S.C.section 360bbb-3(b)(1), unless the authorization is terminated  or revoked sooner.       Influenza A by PCR 01/29/2021 NEGATIVE  NEGATIVE Final   Influenza B by PCR 01/29/2021 NEGATIVE  NEGATIVE Final   Comment: (NOTE) The Xpert Xpress SARS-CoV-2/FLU/RSV plus assay is intended as an aid in the diagnosis of influenza from Nasopharyngeal swab specimens and should not be used as a sole basis for treatment. Nasal washings and aspirates are unacceptable for Xpert Xpress SARS-CoV-2/FLU/RSV testing.  Fact Sheet for Patients: BloggerCourse.com  Fact Sheet for Healthcare Providers: SeriousBroker.it  This test is not yet approved or cleared by the [place] FDA and has been authorized for  detection and/or diagnosis of SARS-CoV-2 by FDA under an Emergency Use Authorization (EUA). This EUA will remain in effect (meaning this test can be used) for the duration of the COVID-19 declaration under Section 564(b)(1) of the Act, 21 U.S.C. section 360bbb-3(b)(1), unless the authorization is terminated or revoked.  Performed at [facility], [address] [place], [place] [address]    Sodium 01/29/2021 138  135 - 145 mmol/L Final   Potassium 01/29/2021 3.7  3.5 - 5.1 mmol/L Final   Chloride 01/29/2021 104  98 - 111 mmol/L Final   CO2 01/29/2021 28  22 - 32 mmol/L Final   Glucose, Bld 01/29/2021 92  70 - 99 mg/dL Final   Glucose reference range applies only to samples taken after fasting for at least 8 hours.   BUN 01/29/2021 6  6 - 20 mg/dL Final   Creatinine, Ser 01/29/2021 0.85  0.61 - 1.24 mg/dL Final   Calcium 35/57/3220 8.8 (L)  8.9 - 10.3 mg/dL Final   Total Protein 25/42/7062 5.9 (L)  6.5 - 8.1 g/dL Final   Albumin 37/62/8315 3.6  3.5 - 5.0 g/dL Final   AST 17/61/6073 15  15 - 41 U/L Final   ALT 01/29/2021 11  0 - 44 U/L Final   Alkaline Phosphatase 01/29/2021 60  38 - 126 U/L Final   Total Bilirubin 01/29/2021 1.0  0.3 - 1.2 mg/dL Final   GFR, Estimated 01/29/2021 >60  >60 mL/min Final   Comment: (NOTE) Calculated using the CKD-EPI Creatinine Equation (2021)    Anion gap 01/29/2021 6  5 - 15 Final   Performed at [facility], [address], [place], [place] [address]   Alcohol, Ethyl (B) 01/29/2021 26 (H)  <10 mg/dL Final   Comment: (NOTE) Lowest detectable limit for serum alcohol is  10 mg/dL.  For medical purposes only. Performed at [facility], [address], [place], [place] [address]    Opiates 01/29/2021 NONE DETECTED  NONE DETECTED Final   Cocaine 01/29/2021 NONE DETECTED  NONE DETECTED Final   Benzodiazepines 01/29/2021 NONE DETECTED  NONE DETECTED Final   Amphetamines 01/29/2021 NONE DETECTED  NONE DETECTED Final    Tetrahydrocannabinol 01/29/2021 POSITIVE (A)  NONE DETECTED Final   Barbiturates 01/29/2021 NONE DETECTED  NONE DETECTED Final   Comment: (NOTE) DRUG SCREEN FOR MEDICAL PURPOSES ONLY.  IF CONFIRMATION IS NEEDED FOR ANY PURPOSE, NOTIFY LAB WITHIN 5 DAYS.  LOWEST DETECTABLE LIMITS FOR URINE DRUG SCREEN Drug Class                     Cutoff (ng/mL) Amphetamine and metabolites    1000 Barbiturate and metabolites    200 Benzodiazepine                 200 Tricyclics and metabolites     300 Opiates and metabolites        300 Cocaine and metabolites        300 THC                            50 Performed at [facility], [address], [place], [place] [address]    WBC 01/29/2021 4.8  4.0 - 10.5 K/uL Final   RBC 01/29/2021 4.64  4.22 - 5.81 MIL/uL Final   Hemoglobin 01/29/2021 13.5  13.0 - 17.0 g/dL Final   HCT 09/81/1914 40.3  39.0 - 52.0 % Final   MCV 01/29/2021 86.9  80.0 - 100.0 fL Final   MCH 01/29/2021 29.1  26.0 - 34.0 pg Final   MCHC 01/29/2021 33.5  30.0 - 36.0 g/dL Final   RDW 78/29/5621 13.2  11.5 - 15.5 % Final   Platelets 01/29/2021 253  150 - 400 K/uL Final   nRBC 01/29/2021 0.0  0.0 - 0.2 % Final   Neutrophils Relative % 01/29/2021 52  % Final   Neutro Abs 01/29/2021 2.5  1.7 - 7.7 K/uL Final   Lymphocytes Relative 01/29/2021 37  % Final   Lymphs Abs 01/29/2021 1.8  0.7 - 4.0 K/uL Final   Monocytes Relative 01/29/2021 8  % Final   Monocytes Absolute 01/29/2021 0.4  0.1 - 1.0 K/uL Final   Eosinophils Relative 01/29/2021 2  % Final   Eosinophils Absolute 01/29/2021 0.1  0.0 - 0.5 K/uL Final   Basophils Relative 01/29/2021 1  % Final   Basophils Absolute 01/29/2021 0.0  0.0 - 0.1 K/uL Final   Immature Granulocytes 01/29/2021 0  % Final   Abs Immature Granulocytes 01/29/2021 0.01  0.00 - 0.07 K/uL Final   Performed at [facility], [address], [place], [place] [address]   Acetaminophen (Tylenol), Serum 01/29/2021 <10 (L)  10 - 30 ug/mL Final   Comment:  (NOTE) Therapeutic concentrations vary significantly. A range of 10-30 ug/mL  may be an effective concentration for many patients. However, some  are best treated at concentrations outside of this range. Acetaminophen concentrations >150 ug/mL at 4 hours after ingestion  and >50 ug/mL at 12 hours after ingestion are often associated with  toxic reactions.  Performed at [facility], [address], [place], [place] [address]    Salicylate Lvl 01/29/2021 <7.0 (L)  7.0 - 30.0 mg/dL Final   Performed  at [facility], [address] [place], [place] [address]   Troponin I (High Sensitivity) 01/29/2021 3  <18 ng/L Final   Comment: (NOTE) Elevated high sensitivity troponin I (hsTnI) values and significant  changes across serial measurements may suggest ACS but many other  chronic and acute conditions are known to elevate hsTnI results.  Refer to the "Links" section for chest pain algorithms and additional  guidance. Performed at [facility], [address] [place], [place] [address]    Troponin I (High Sensitivity) 01/29/2021 3  <18 ng/L Final   Comment: (NOTE) Elevated high sensitivity troponin I (hsTnI) values and significant  changes across serial measurements may suggest ACS but many other  chronic and acute conditions are known to elevate hsTnI results.  Refer to the "Links" section for chest pain algorithms and additional  guidance. Performed at [facility], [address] [place], [place] [address]     Allergies: Amoxicillin  PTA Medications: (Not in a hospital admission)   Medical Decision Making  [facility] with passive SI with no intent, plan, or access to means. However he does not feel that he can keep himself safe if he were to be discharged. He will be admitted to the continuous assessment unit for overnight observation. He will be reevaluated in the am by psychiatry.     Recommendations  Based on my evaluation the  patient does not appear to have an emergency medical condition.  Patient will be admitted to  the continuous assessment unit for overnight observation. He will be reevaluated by psychiatry in am.  Lab work ordered:   GC chlamydia, RPR, urine cytology for -Gonorrhea, Trichomonas, Candida, Gardnerella. U/A and HIV ordered. Patient admits to unprotected sex and is having swelling around the skin at the top of his penis.   CBC w diff, CMP, UDS, ethanol, Hemoglobin A1C, Lipid panel, magnesium RPR, TSH Covid POC and PCR.  Medications: Patient will be started on Lexapro 10 mg QD.    Ardis Hughsarolyn H Jameel Quant, NP 10/25/21  5:12 PM

## 2021-10-25 NOTE — BH Assessment (Signed)
Comprehensive Clinical Assessment (CCA) Note  10/25/2021 Evan Small. [id number]  Disposition: Per C. Effie Shy, NP, Pt is to be admitted to OBS to restart medication  The patient demonstrates the following risk factors for suicide: Chronic risk factors for suicide include: psychiatric disorder of MDD and cannabis and alcohol use . Acute risk factors for suicide include: unemployment, social withdrawal/isolation, and loss (financial, interpersonal, professional). Protective factors for this patient include: coping skills. Considering these factors, the overall suicide risk at this point appears to be moderate. Patient is not appropriate for outpatient follow up.   Flowsheet Row ED from 10/25/2021 in [facility] ED from 01/29/2021 in [facility] EMERGENCY DEPARTMENT ED from 11/27/2020 in [facility] EMERGENCY DEPARTMENT  C-SSRS RISK CATEGORY Moderate Risk High Risk No Risk       Chief Complaint:  Chief Complaint  Patient presents with   Adjustment Disorder    Pt is a 21 year old male who presented to [facility] with depressive symptoms -- sadness, hopelessness   Depression    Pt is a 21 year old male who presented to [facility] with complaint of depression due to recent stressors -- mother died, girlfriend broke up with him, unemployed.   Visit Diagnosis: Major Depressive Disorder, Recurrent, Severe w/o psychotic features   Narrative:  Pt is a 21 year old male who presented to [facility] as a voluntary walk-in with complaint of episodic suicidal ideation, despondency, poor sleep, and other symptoms.  Pt lives in [place] with his father, and he is unemployed.  He has been seen by TTS before for depressive symptoms.  Pt stated that he has been depressed since mid-January, and that he has experienced several stressors contributing to his depression -- his mother died in late 12-12-22he is unemployed, and his girlfriend broke up with him in  late January 2023.  Pt reported that he has instances of suicidal ideation with thoughts of method but not plan or intent.  He also endorsed persistent despondency, isolation, hopelessness and worthlessness, insomnia.  Pt denied homicidal ideation and hallucination.  Pt endorsed daily use of marijuana and weekly use of alcohol.  Last use of marijuana was today.  Last use of alcohol was 10/24/2021.    Pt reported that he has been prescribed psychotropic medication before, but he stopped because he could not tolerate it.  During assessment, pt presented as alert and oriented.  He had good eye contact and was cooperative.  Demeanor was tearful.  Pt was dressed in street clothes, and he appeared appropriately groomed.  Mood was depressed, and affect was blunted.  Speech was normal in rate, rhythm, and volume.  Thought processes were within normal range, and thought content was logical and goal-oriented.  There was no evidence of delusion.  Memory and concentration were fair.  Insight, judgment, and impulse control were fair.  CCA Screening, Triage and Referral (STR)  Patient Reported Information How did you hear about [place]? Self  What Is the Reason for Your Visit/Call Today? Depression; relationship issues, and also grief (mother died).  How Long Has This Been Causing You Problems? 1-6 months  What Do You Feel Would Help You the Most Today? Treatment for Depression or other mood problem; Medication(s)   Have You Recently Had Any Thoughts About Hurting Yourself? Yes (Pt endorsed occasional thoughts of SI)  Are You Planning to Commit Suicide/Harm Yourself At This time? No   Have you Recently Had Thoughts About Hurting Someone  Else? No  Are You Planning to Harm Someone at This Time? No  Explanation: No data recorded  Have You Used Any Alcohol or Drugs in the Past 24 Hours? Yes  How Long Ago Did You Use Drugs or Alcohol? No data recorded What Did You Use and How Much? Marijuana- a blunt; around  3:30 pm   Do You Currently Have a Therapist/Psychiatrist? No  Name of Therapist/Psychiatrist: No data recorded  Have You Been Recently Discharged From Any Office Practice or Programs? No data recorded Explanation of Discharge From Practice/Program: No data recorded    CCA Screening Triage Referral Assessment Type of Contact: Tele-Assessment  Telemedicine Service Delivery:   Is this Initial or Reassessment? Initial Assessment  Date Telepsych consult ordered in [facility]:  01/29/21  Time Telepsych consult ordered in [facility]:  2038  Location of Assessment: [facility] ED  Provider Location: No data recorded  Collateral Involvement: Pt declined for clinician to contact supports to obtain addtional information.   Does Patient Have a Automotive engineer Guardian? No data recorded Name and Contact of Legal Guardian: No data recorded If Minor and Not Living with Parent(s), Who has Custody? No data recorded Is CPS involved or ever been involved? No data recorded Is APS involved or ever been involved? No data recorded  Patient Determined To Be At Risk for Harm To Self or Others Based on Review of Patient Reported Information or Presenting Complaint? Yes, for Self-Harm  Method: No data recorded Availability of Means: No data recorded Intent: No data recorded Notification Required: No data recorded Additional Information for Danger to Others Potential: No data recorded Additional Comments for Danger to Others Potential: No data recorded Are There Guns or Other Weapons in Your Home? No data recorded Types of Guns/Weapons: No data recorded Are These Weapons Safely Secured?                            No data recorded Who Could Verify You Are Able To Have These Secured: No data recorded Do You Have any Outstanding Charges, Pending Court Dates, Parole/Probation? No data recorded Contacted To Inform of Risk of Harm To Self or Others: No data recorded   Does Patient Present under Involuntary  Commitment? No  IVC Papers Initial File Date: No data recorded  [place] of Residence: [place]   Patient Currently Receiving the Following Services: Not Receiving Services   Determination of Need: Urgent (48 hours)   Options For Referral: Medication Management; Inpatient Hospitalization; Outpatient Therapy     CCA Biopsychosocial Patient Reported Schizophrenia/Schizoaffective Diagnosis in Past: No   Strengths: Goal oriented, communication.   Mental Health Symptoms Depression:   Difficulty Concentrating; Sleep (too much or little); Worthlessness; Hopelessness   Duration of Depressive symptoms:  Duration of Depressive Symptoms: Greater than two weeks   Mania:   None   Anxiety:    Restlessness; Sleep; Tension; Worrying   Psychosis:   None   Duration of Psychotic symptoms:    Trauma:   None   Obsessions:   None   Compulsions:   None   Inattention:   None   Hyperactivity/Impulsivity:   None   Oppositional/Defiant Behaviors:   None   Emotional Irregularity:   Mood lability; Intense/inappropriate anger   Other Mood/Personality Symptoms:  No data recorded   Mental Status Exam Appearance and self-care  Stature:   Average   Weight:   Average weight   Clothing:   Casual   Grooming:  Normal   Cosmetic use:   None   Posture/gait:   Normal   Motor activity:   Not Remarkable   Sensorium  Attention:   Normal   Concentration:   Normal   Orientation:   X5   Recall/memory:   Normal   Affect and Mood  Affect:   Blunted   Mood:   Depressed   Relating  Eye contact:   Normal   Facial expression:   Depressed   Attitude toward examiner:   Cooperative   Thought and Language  Speech flow:  Normal   Thought content:   Appropriate to Mood and Circumstances   Preoccupation:   None   Hallucinations:   None   Organization:  No data recorded  [employer] of Knowledge:   Average   Intelligence:    Average   Abstraction:   Normal   Judgement:   Fair   Dance movement psychotherapist:   Realistic   Insight:   Fair   Decision Making:   Normal   Social Functioning  Social Maturity:   Isolates   Social Judgement:   Victimized   Stress  Stressors:   Grief/losses; Work   Coping Ability:   Human resources officer Deficits:   Merchant navy officer   Supports:   Other (Comment) (Per pt he has supports sometimes.)     Religion: Religion/Spirituality Are You A Religious Person?: No  Leisure/Recreation: Leisure / Recreation Do You Have Hobbies?: Yes Leisure and Hobbies: Making music, work.  Exercise/Diet: Exercise/Diet Do You Exercise?: Yes What Type of Exercise Do You Do?: Run/Walk How Many Times a Week Do You Exercise?: 4-5 times a week Have You Gained or Lost A Significant Amount of Weight in the Past Six Months?: No Do You Follow a Special Diet?: No Do You Have Any Trouble Sleeping?: Yes Explanation of Sleeping Difficulties: Difficulty with sleep -- does not like to sleep   CCA Employment/Education Employment/Work Situation: Employment / Work Situation Employment Situation: Unemployed Has Patient ever Been in Equities trader?: No  Education: Education Is Patient Currently Attending School?: No Last Grade Completed: 12 Did You Product manager?: No Did You Have An Individualized Education Program (IIEP): No Did You Have Any Difficulty At [employer]?: No Patient's Education Has Been Impacted by Current Illness: No   CCA Family/Childhood History Family and Relationship History: Family history Marital status: Single Does patient have children?: No  Childhood History:  Childhood History By whom was/is the patient raised?: Mother, Father Did patient suffer any verbal/emotional/physical/sexual abuse as a child?: No Did patient suffer from severe childhood neglect?: No Has patient ever been sexually abused/assaulted/raped as an adolescent or adult?: No Was  the patient ever a victim of a crime or a disaster?: No Witnessed domestic violence?: No Has patient been affected by domestic violence as an adult?: No  Child/Adolescent Assessment:     CCA Substance Use Alcohol/Drug Use: Alcohol / Drug Use Pain Medications: See MAR Prescriptions: See MAR Over the Counter: See MAR History of alcohol / drug use?: Yes Substance #1 Name of Substance 1: Marijuana 1 - Amount (size/oz): Varied 1 - Frequency: Daily 1 - Duration: Ongoing 1 - Last Use / Amount: 10/25/2021 -- a little less than a blunt 1 - Method of Aquiring: street purchase 1- Route of Use: Oral inhalation Substance #2 Name of Substance 2: Alcohol 2 - Amount (size/oz): Varied 2 - Frequency: Weekly 2 - Duration: Ongoing 2 - Last Use / Amount: 10/24/2021 2 - Method of  Aquiring: purchase 2 - Route of Substance Use: oral ingestion                     ASAM's:  Six Dimensions of Multidimensional Assessment  Dimension 1:  Acute Intoxication and/or Withdrawal Potential:   Dimension 1:  Description of individual's past and current experiences of substance use and withdrawal: Used THC an hour ago  Dimension 2:  Biomedical Conditions and Complications:   Dimension 2:  Description of patient's biomedical conditions and  complications: No concerns  Dimension 3:  Emotional, Behavioral, or Cognitive Conditions and Complications:  Dimension 3:  Description of emotional, behavioral, or cognitive conditions and complications: History of depression  Dimension 4:  Readiness to Change:     Dimension 5:  Relapse, Continued use, or Continued Problem Potential:     Dimension 6:  Recovery/Living Environment:  Dimension 6:  Recovery/Iiving environment criteria description: Pt lives with father, trying to leave soon  ASAM Severity Score: ASAM's Severity Rating Score: 5  ASAM Recommended Level of Treatment: ASAM Recommended Level of Treatment: Level I Outpatient Treatment   Substance use Disorder  (SUD) Substance Use Disorder (SUD)  Checklist Symptoms of Substance Use: Continued use despite having a persistent/recurrent physical/psychological problem caused/exacerbated by use  Recommendations for Services/Supports/Treatments: Recommendations for Services/Supports/Treatments Recommendations For Services/Supports/Treatments: SAIOP (Substance Abuse Intensive Outpatient Program)  Discharge Disposition:    DSM5 Diagnoses: Patient Active Problem List   Diagnosis Date Noted   Major depressive disorder, single episode, severe (HCC)      Referrals to Alternative Service(s): Referred to Alternative Service(s):   Place:   Date:   Time:    Referred to Alternative Service(s):   Place:   Date:   Time:    Referred to Alternative Service(s):   Place:   Date:   Time:    Referred to Alternative Service(s):   Place:   Date:   Time:     Earline Mayotteugene T Carlena Ruybal, [facility]

## 2021-10-25 NOTE — ED Notes (Signed)
[facility] called to collect STAT lab specimens and to deliver to [facility].

## 2021-10-25 NOTE — ED Notes (Signed)
Received patient this PM. Patient in his bed sleeping. Patient respirations are even and unlabored. Will continue to monitor for safety.  °

## 2021-10-25 NOTE — ED Notes (Signed)
Pt received medication (Lexapro) without difficulty. Meal eaten. Safety maintained.

## 2021-10-26 LAB — RPR: RPR Ser Ql: NONREACTIVE

## 2021-10-26 MED ORDER — ESCITALOPRAM OXALATE 10 MG PO TABS
10.0000 mg | ORAL_TABLET | Freq: Every day | ORAL | 0 refills | Status: AC
Start: 2021-10-26 — End: ?

## 2021-10-26 NOTE — ED Provider Notes (Signed)
FBC/OBS ASAP Discharge Summary  Date and Time: 10/26/2021 8:51 AM  Name: Evan Small.  MRN:  [id number]   Discharge Diagnoses:  Final diagnoses:  Adjustment disorder with mixed anxiety and depressed mood    Subjective:  Evan Small., 21 y.o., male patient presented to [facility] on 10/25/2021 for worsening depression and passive SI. He was admitted to the continuous assessment unit for overnight observation.  Patient seen face to face by this provider, consulted with Dr. Bronwen Betters; and  chart reviewed on 10/26/21.    During evaluation Evan Small. Is laying in his bed asleep, he is easily awakened. He is alert/oriented x 4. He is calm/cooperative. He pleasant and states he is feeling better.  States most of his stress he experienced last night is due to the break up with his girlfriend. He continues to endorses depression. Reports his anxiety is better. He is denying SI. He denies any plan, intent, or access to means. He denies AVH/HI. He is logical and answered questions appropriately. Discussed with patient that on admission his UDS was positive for cocaine and THC. He continues to deny cocaine use. His BAL was 26. Discussed STD testing and provided HIV negative results. Urine cytology has not been resulted. Encouraged patient to check MY CHART for results and if any test comes pack positive he would be notified. Patient is requesting to be discharged. He is contracting for safety. He deny's any collateral to be contacted.    Stay Summary:   Evan Small. was admitted to [facility] Continuous Assessment unit for increased depression, passive SI and crisis management.  He was treated with Lexapro which was tolerated with no adverse reactions.  Evan Small. was discharged with current medication and was instructed on how to take medication as prescribed. He was given a 7 day sample and a one month prescription. Discussed follow up with Behavioral Health Outpatient services on the  2cd floor including open access walk in hours.   Birdie Sons Duesing Jr.'s improvement was monitored by continuous assessment/observation and his report of symptom reduction.  His emotional and mental status was also monitored by staff. He remained calm/cooperative on the unit. He exhibited no unsafe behaviors. He was offered further treatment options upon discharge including but not limited to Residential, Intensive Outpatient, Outpatient treatment, Rehabilitation services.  Evan Small. will follow up with the services as listed below under Follow up Information.     Upon completion of this admission the [employer]. was both mentally and medically stable for discharge denying suicidal/homicidal ideation, auditory/visual/tactile hallucinations, delusional thoughts and paranoia.     Total Time spent with patient: 20 minutes  Past Psychiatric History: see h&p Past Medical History:  Past Medical History:  Diagnosis Date   Migraines    Seasonal allergies     Past Surgical History:  Procedure Laterality Date   ANKLE SURGERY     WISDOM TOOTH EXTRACTION     Family History:  Family History  Problem Relation Age of Onset   Heart failure Mother    Family Psychiatric History: see h&p Social History:  Social History   Substance and Sexual Activity  Alcohol Use Yes   Comment: occ     Social History   Substance and Sexual Activity  Drug Use Yes   Types: Marijuana    Social History   Socioeconomic History   Marital status: Single    Spouse name: Not on file  Number of children: Not on file   Years of education: Not on file   Highest education level: Not on file  Occupational History   Not on file  Tobacco Use   Smoking status: Some Days    Types: Cigarettes   Smokeless tobacco: Never   Tobacco comments:    black n mild occ  Substance and Sexual Activity   Alcohol use: Yes    Comment: occ   Drug use: Yes    Types: Marijuana   Sexual activity: Not on file  Other  Topics Concern   Not on file  Social History Narrative   Not on file   Social Determinants of Health   Financial Resource Strain: Not on file  Food Insecurity: Not on file  Transportation Needs: Not on file  Physical Activity: Not on file  Stress: Not on file  Social Connections: Not on file   SDOH:  SDOH Screenings   Alcohol Screen: Not on file  Depression (PHQ2-9): Medium Risk   PHQ-2 Score: 16  Financial Resource Strain: Not on file  Food Insecurity: Not on file  Housing: Not on file  Physical Activity: Not on file  Social Connections: Not on file  Stress: Not on file  Tobacco Use: High Risk   Smoking Tobacco Use: Some Days   Smokeless Tobacco Use: Never   Passive Exposure: Not on file  Transportation Needs: Not on file    Tobacco Cessation:  N/A, patient does not currently use tobacco products  Current Medications:  Current Facility-Administered Medications  Medication Dose Route Frequency Provider Last Rate Last Admin   acetaminophen (TYLENOL) tablet 650 mg  650 mg Oral Q6H PRN Ardis Hughsoleman, Skylarr Liz H, NP       alum & mag hydroxide-simeth (MAALOX/MYLANTA) 200-200-20 MG/5ML suspension 30 mL  30 mL Oral Q4H PRN Ardis Hughsoleman, Tihanna Goodson H, NP       escitalopram (LEXAPRO) tablet 10 mg  10 mg Oral Daily Ardis Hughsoleman, Onie Hayashi H, NP   10 mg at 10/25/21 1800   hydrOXYzine (ATARAX) tablet 25 mg  25 mg Oral TID PRN Ardis Hughsoleman, Caydence Enck H, NP       magnesium hydroxide (MILK OF MAGNESIA) suspension 30 mL  30 mL Oral Daily PRN Ardis Hughsoleman, Lalanya Rufener H, NP       traZODone (DESYREL) tablet 50 mg  50 mg Oral QHS PRN Ardis Hughsoleman, Cecila Satcher H, NP       Current Outpatient Medications  Medication Sig Dispense Refill   albuterol (VENTOLIN HFA) 108 (90 Base) MCG/ACT inhaler Inhale 1 puff into the lungs every 4 (four) hours as needed for wheezing. (Patient not taking: Reported on 10/26/2021)     escitalopram (LEXAPRO) 10 MG tablet Take 1 tablet (10 mg total) by mouth daily. 30 tablet 0   fluticasone (FLONASE) 50 MCG/ACT  nasal spray Place 2 sprays into both nostrils daily. (Patient not taking: Reported on 10/26/2021)      PTA Medications: (Not in a hospital admission)   Musculoskeletal  Strength & Muscle Tone: within normal limits Gait & Station: normal Patient leans: N/A  Psychiatric Specialty Exam  Presentation  General Appearance: Appropriate for Environment; Casual  Eye Contact:Good  Speech:Clear and Coherent; Normal Rate  Speech Volume:Normal  Handedness:Right   Mood and Affect  Mood:Depressed  Affect:Congruent   Thought Process  Thought Processes:Coherent  Descriptions of Associations:Intact  Orientation:Full (Time, Place and Person)  Thought Content:Logical  Diagnosis of Schizophrenia or Schizoaffective disorder in past: No    Hallucinations:Hallucinations: None  Ideas of Reference:None  Suicidal  Thoughts:Suicidal Thoughts: No SI Passive Intent and/or Plan: Without Means to Carry Out  Homicidal Thoughts:Homicidal Thoughts: No   Sensorium  Memory:Immediate Good; Recent Good; Remote Good  Judgment:Good  Insight:Good   Executive Functions  Concentration:Good  Attention Span:Good  Recall:Good  Fund of Knowledge:Good  Language:Good   Psychomotor Activity  Psychomotor Activity:Psychomotor Activity: Normal   Assets  Assets:Communication Skills; Desire for Improvement; Financial Resources/Insurance; Housing; Physical Health; Social Support; Resilience   Sleep  Sleep:Sleep: Fair   Nutritional Assessment (For OBS and FBC admissions only) Has the patient had a weight loss or gain of 10 pounds or more in the last 3 months?: No Has the patient had a decrease in food intake/or appetite?: Yes Does the patient have dental problems?: No Does the patient have eating habits or behaviors that may be indicators of an eating disorder including binging or inducing vomiting?: No Has the patient recently lost weight without trying?: 0    Physical Exam  Physical  Exam Vitals and nursing note reviewed.  Constitutional:      General: He is not in acute distress.    Appearance: He is well-developed.  HENT:     Head: Normocephalic and atraumatic.  Eyes:     General:        Right eye: No discharge.        Left eye: No discharge.     Conjunctiva/sclera: Conjunctivae normal.  Cardiovascular:     Rate and Rhythm: Normal rate.  Pulmonary:     Effort: Pulmonary effort is normal. No respiratory distress.  Musculoskeletal:        General: No swelling. Normal range of motion.     Cervical back: Normal range of motion.  Skin:    Coloration: Skin is not jaundiced or pale.  Neurological:     Mental Status: He is alert and oriented to person, place, and time.  Psychiatric:        Attention and Perception: Attention and perception normal.        Mood and Affect: Mood normal. Mood is not anxious or depressed.        Speech: Speech normal.        Behavior: Behavior normal. Behavior is cooperative.        Thought Content: Thought content normal.        Cognition and Memory: Cognition normal.        Judgment: Judgment normal.   Review of Systems  Constitutional: Negative.   HENT: Negative.    Eyes: Negative.   Respiratory: Negative.    Cardiovascular: Negative.   Musculoskeletal: Negative.   Skin: Negative.   Neurological: Negative.   Endo/Heme/Allergies: Negative.   Psychiatric/Behavioral:  Positive for depression. The patient is nervous/anxious.   Blood pressure 102/62, pulse 60, temperature 97.9 F (36.6 C), temperature source Oral, resp. rate 18, height 5\' 6"  (1.676 m), weight 175 lb (79.4 kg), SpO2 100 %. Body mass index is 28.25 kg/m.  Demographic Factors:  Male, Adolescent or young adult, and Unemployed  Loss Factors: Loss of significant relationship  Historical Factors: Impulsivity  Risk Reduction Factors:   Sense of responsibility to family, Living with another person, especially a relative, Positive social support, Positive  therapeutic relationship, and Positive coping skills or problem solving skills  Continued Clinical Symptoms:  Severe Anxiety and/or Agitation Depression:   Comorbid alcohol abuse/dependence Impulsivity Alcohol/Substance Abuse/Dependencies  Cognitive Features That Contribute To Risk:  None    Suicide Risk:  Minimal: No identifiable suicidal ideation.  Patients presenting with  no risk factors but with morbid ruminations; may be classified as minimal risk based on the severity of the depressive symptoms  Plan Of Care/Follow-up recommendations:  Activity:  as tolerated  Diet:  regular   Disposition:   Discharge patient.  Lexapro 10 mg PO QD 7 day sample and a one month prescription provided.   Behavioral Health Outpatient services on the 2cd floor including open access walk in hours. Also provided resources for [facility], [employer] and [employer].   Provided [place] [facility] for primary care needs.   No evidence of imminent risk to self or others at present.    Patient does not meet criteria for psychiatric inpatient admission. Discussed crisis plan, support from social network, calling 911, coming to the Emergency Department, and calling Suicide Hotline.    Ardis Hughs, NP 10/26/2021, 8:51 AM

## 2021-10-26 NOTE — Discharge Instructions (Signed)
Patient is instructed prior to discharge to: Take all medications as prescribed by his/her mental healthcare provider. °Report any adverse effects and or reactions from the medicines to his/her outpatient provider promptly. °Patient has been instructed & cautioned: To not engage in alcohol and or illegal drug use while on prescription medicines. °In the event of worsening symptoms, patient is instructed to call the crisis hotline, 911 and or go to the nearest ED for appropriate evaluation and treatment of symptoms. °To follow-up with his/her primary care provider for your other medical issues, concerns and or health care needs. °   ° °The suicide prevention education provided includes the following: °Suicide risk factors °Suicide prevention and interventions °National Suicide Hotline telephone number °[facility] assessment telephone number °[place] Emergency Assistance 911 °County and/or Residential Mobile Crisis Unit telephone number °  °Request made of family/significant other to: °Remove weapons (e.g., guns, rifles, knives), all items previously/currently identified as safety concern.   °Remove drugs/medications (over the counter, prescriptions, illicit drugs), all items previously/currently identified as a safety concern.  ° °Please contact one of the following facilities to start medication management and therapy services:  ° °[facility] °[address] (SECOND FLOOR)  °[place], [place]  [address] °Phone: [phone] ° °[facility]  °[address] °[place], [place] [address] °Phone: [phone] ° °[facility] [place]  °[address]  °[place], [place] [address] ° °[facility] [place]  °[address]  °[place], [place] [address] °Phone: [phone]  ° °

## 2021-10-26 NOTE — ED Notes (Signed)
Pt is awake and alert this am. He flat expression and anxious mood.  Pt denies SI, HI or AVH at this time.  He is asking to be discharged.  Provider made aware. Pt was offered breakfast.   Will continue to monitor. And await dispo.

## 2021-10-26 NOTE — ED Notes (Signed)
Pt given avs and medication rx and sample as prescribed.  He verbalized understanding of follow up.  Pt was given all belongings out of locker 17.  Denies SI, HI or AVH at time of discharge and was escorted off unit with no distress noted.  Marland Kitchen

## 2021-10-26 NOTE — ED Notes (Signed)
Patient has been sleeping since the shift began.  Patient is currently asleep in his bed. Patient respirations are even and unlabored. Will continue to monitor for safety.

## 2021-11-02 ENCOUNTER — Other Ambulatory Visit: Payer: Self-pay

## 2021-11-02 ENCOUNTER — Encounter (HOSPITAL_COMMUNITY): Payer: Self-pay

## 2021-11-02 ENCOUNTER — Ambulatory Visit (HOSPITAL_COMMUNITY): Payer: Medicaid Other | Admitting: Clinical

## 2021-11-13 ENCOUNTER — Other Ambulatory Visit: Payer: Self-pay

## 2021-11-13 ENCOUNTER — Emergency Department (HOSPITAL_COMMUNITY): Payer: Medicaid Other

## 2021-11-13 ENCOUNTER — Encounter (HOSPITAL_COMMUNITY): Payer: Self-pay

## 2021-11-13 ENCOUNTER — Emergency Department (HOSPITAL_COMMUNITY)
Admission: EM | Admit: 2021-11-13 | Discharge: 2021-11-13 | Disposition: A | Discharge status: Home / Self Care | Payer: Medicaid Other | Attending: Emergency Medicine | Admitting: Emergency Medicine

## 2021-11-13 DIAGNOSIS — R079 Chest pain, unspecified: Secondary | ICD-10-CM | POA: Insufficient documentation

## 2021-11-13 DIAGNOSIS — R55 Syncope and collapse: Secondary | ICD-10-CM | POA: Insufficient documentation

## 2021-11-13 DIAGNOSIS — R42 Dizziness and giddiness: Secondary | ICD-10-CM | POA: Diagnosis not present

## 2021-11-13 LAB — BASIC METABOLIC PANEL
Anion gap: 8 (ref 5–15)
BUN: 8 mg/dL (ref 6–20)
CO2: 28 mmol/L (ref 22–32)
Calcium: 9.3 mg/dL (ref 8.9–10.3)
Chloride: 100 mmol/L (ref 98–111)
Creatinine, Ser: 0.9 mg/dL (ref 0.61–1.24)
GFR, Estimated: >60 mL/min (ref >60)
Glucose, Bld: 93 mg/dL (ref 70–99)
Potassium: 3.3 mmol/L — ABNORMAL LOW (ref 3.5–5.1)
Sodium: 136 mmol/L (ref 135–145)

## 2021-11-13 LAB — CBC
HCT: 42.9 % (ref 39.0–52.0)
Hemoglobin: 14.5 g/dL (ref 13.0–17.0)
MCH: 28.9 pg (ref 26.0–34.0)
MCHC: 33.8 g/dL (ref 30.0–36.0)
MCV: 85.6 fL (ref 80.0–100.0)
Platelets: 257 10*3/uL (ref 150–400)
RBC: 5.01 MIL/uL (ref 4.22–5.81)
RDW: 13.1 % (ref 11.5–15.5)
WBC: 6.8 10*3/uL (ref 4.0–10.5)
nRBC: 0 % (ref 0.0–0.2)

## 2021-11-13 LAB — D-DIMER, QUANTITATIVE: D-Dimer, Quant: <0.27 ug/mL-FEU (ref 0.00–0.50)

## 2021-11-13 LAB — TROPONIN I (HIGH SENSITIVITY)
Troponin I (High Sensitivity): 3 ng/L (ref <18)
Troponin I (High Sensitivity): 4 ng/L (ref <18)

## 2021-11-13 LAB — MAGNESIUM: Magnesium: 1.9 mg/dL (ref 1.7–2.4)

## 2021-11-13 MED ORDER — POTASSIUM CHLORIDE CRYS ER 20 MEQ PO TBCR
20.0000 meq | EXTENDED_RELEASE_TABLET | Freq: Once | ORAL | Status: AC
  Administered 2021-11-13: 20 meq via ORAL
  Filled 2021-11-13: qty 1

## 2021-11-13 NOTE — ED Triage Notes (Signed)
Pt arrived POV from home c/o CP and a syncopal episode. Pt states he was getting ready for work when his chest started hurting this morning and he became dizzy and light headed and next thing he knows he woke up on the floor. Pt states he has passed out one time before a long time ago.  ?

## 2021-11-13 NOTE — ED Provider Notes (Signed)
?[facility] EMERGENCY DEPARTMENT ?Provider Note ? ? ?CSN: [id number] ?Arrival date & time: 11/13/21  1016 ? ?  ? ?History ? ?Chief Complaint  ?Patient presents with  ? Loss of Consciousness  ? ? ?Evan River. is a 21 y.o. male  Pt complains of having episode of lightheadedness, syncope, with chest pain this morning.  Patient reports that he woke up like normal, was standing up ready for his day when he suddenly felt quite lightheaded, passed out and woke up on the ground.  Patient denies any significant injury, head pain from the fall.  Patient reports that since then he had some pressure in the chest that is worse with inspiration.  Patient denies any personal history of blood clots but endorses family history of blood clots.  Patient does endorse some early cardiac death in his family with mother dying at 104 of cardiac arrest, grandmother dying at 5 of cardiac arrest.  No known diagnosis of HOCM, or other fatal arrhythmia. ? ?Patient does endorse that he does not eat or drink very much in general this is not a new change for him.  He reports that he has maybe 1 meal a day, drinks a low amount of water. ? ? ?Loss of Consciousness ?Associated symptoms: chest pain   ? ?  ? ?Home Medications ?Prior to Admission medications   ?Medication Sig Start Date End Date Taking? Authorizing Provider  ?albuterol (VENTOLIN HFA) 108 (90 Base) MCG/ACT inhaler Inhale 1 puff into the lungs every 4 (four) hours as needed for wheezing. ?Patient not taking: Reported on 10/26/2021 04/28/17   [provider]  ?escitalopram (LEXAPRO) 10 MG tablet Take 1 tablet (10 mg total) by mouth daily. 10/26/21   Ardis Hughs, NP  ?fluticasone (FLONASE) 50 MCG/ACT nasal spray Place 2 sprays into both nostrils daily. ?Patient not taking: Reported on 10/26/2021 09/17/21   [provider]  ?   ? ?Allergies    ?Amoxicillin   ? ?Review of Systems   ?Review of Systems  ?Cardiovascular:  Positive for chest pain and  syncope.  ?All other systems reviewed and are negative. ? ?Physical Exam ?Updated Vital Signs ?BP 133/76   Pulse 77   Temp 98.6 ?F (37 ?C)   Resp 16   Ht 5\' 6"  (1.676 m)   Wt 83.9 kg   SpO2 100%   BMI 29.86 kg/m?  ?Physical Exam ?Vitals and nursing note reviewed.  ?Constitutional:   ?   General: He is not in acute distress. ?   Appearance: Normal appearance.  ?HENT:  ?   Head: Normocephalic and atraumatic.  ?Eyes:  ?   General:     ?   Right eye: No discharge.     ?   Left eye: No discharge.  ?Cardiovascular:  ?   Rate and Rhythm: Normal rate and regular rhythm.  ?   Heart sounds: No murmur heard. ?  No friction rub. No gallop.  ?Pulmonary:  ?   Effort: Pulmonary effort is normal.  ?   Breath sounds: Normal breath sounds.  ?Abdominal:  ?   General: Bowel sounds are normal.  ?   Palpations: Abdomen is soft.  ?Skin: ?   General: Skin is warm and dry.  ?   Capillary Refill: Capillary refill takes less than 2 seconds.  ?Neurological:  ?   Mental Status: He is alert and oriented to person, place, and time.  ?   Comments: Cranial nerves II through XII grossly  intact.  Intact finger-nose, intact heel-to-shin.  Romberg negative, gait normal.  Alert and oriented x3.  Moves all 4 limbs spontaneously, normal coordination.  No pronator drift.  Intact strength 5 out of 5 bilateral upper and lower extremities. ? ?  ?Psychiatric:     ?   Mood and Affect: Mood normal.     ?   Behavior: Behavior normal.  ? ? ?ED Results / Procedures / Treatments   ?Labs ?(all labs ordered are listed, but only abnormal results are displayed) ?Labs Reviewed  ?BASIC METABOLIC PANEL - Abnormal; Notable for the following components:  ?    Result Value  ? Potassium 3.3 (*)   ? All other components within normal limits  ?CBC  ?D-DIMER, QUANTITATIVE  ?MAGNESIUM  ?TROPONIN I (HIGH SENSITIVITY)  ?TROPONIN I (HIGH SENSITIVITY)  ? ? ?EKG ?EKG Interpretation ? ?Date/Time:  Friday November 13 2021 10:10:26 EDT ?Ventricular Rate:  77 ?PR Interval:  142 ?QRS  Duration: 90 ?QT Interval:  392 ?QTC Calculation: 443 ?R Axis:   51 ?Text Interpretation: Normal sinus rhythm Normal ECG When compared with ECG of 25-Oct-2021 17:25, PREVIOUS ECG IS PRESENT when compared to prior, similar appearance with possible? delta wave. No STEMI Confirmed by Theda Belfastegeler, Chris ([id number]) on 11/13/2021 12:41:03 PM ? ?Radiology ?DG Chest 2 View ? ?Result Date: 11/13/2021 ?CLINICAL DATA:  21 year old male with chest pain, syncope. EXAM: CHEST - 2 VIEW COMPARISON:  Chest radiographs 09/22/2019. FINDINGS: Larger lung volumes today. Both lungs appear clear. No pneumothorax or pleural effusion. Normal cardiac size and mediastinal contours. Visualized tracheal air column is within normal limits. No acute osseous abnormality identified. Negative visible bowel gas. IMPRESSION: Negative.  No cardiopulmonary abnormality. Electronically Signed   By: Odessa FlemingH  Hall M.D.   On: 11/13/2021 11:24   ? ?Procedures ?Procedures  ? ? ?Medications Ordered in ED ?Medications  ?potassium chloride SA (KLOR-CON M) CR tablet 20 mEq (20 mEq Oral Given 11/13/21 1301)  ? ? ?ED Course/ Medical Decision Making/ A&P ?Clinical Course as of 11/13/21 1513  ?Fri Nov 13, 2021  ?1247 Spoke with cardiologist Dr. Algie CofferKadakia who will take a look at his EKG, and requests an echo to further stratify patient's risk for cardiogenic syncope.  Dr. Algie CofferKadakia reports that he will come down and chat with this patient. [CP]  ?1253 Dr. Algie CofferKadakia at bedside and we will wait for his updates [CP]  ?  ?Clinical Course User Index ?[CP] Abrianna Sidman H, PA-C  ? ?                        ?Medical Decision Making ?Amount and/or Complexity of Data Reviewed ?Labs: ordered. ?Radiology: ordered. ? ? ?This patient presents to the ED for concern of lightheadedness, syncope with some chest pressure after collapse, this involves an extensive number of treatment options, and is a complaint that carries with it a high risk of complications and morbidity. The emergent differential  diagnosis prior to evaluation includes, but is not limited to, ACS, aortic dissection, aneurysm, PE, dehydration, Boerhaave's, Mallory-Weiss, electrolyte abnormality, seizure, stroke, fetal arrhythmia including WPW, LGL versus other. ? ?This is not an exhaustive differential.  ? ?Past Medical History / Co-morbidities / Social History: ?Previous episode of syncope x1 with no prior work-up.  History of migraines.  History of depression. ? ?Physical Exam: ?Physical exam performed. The pertinent findings include: Overall well-appearing male no acute distress laying on bed.  No tenderness to palpation of the chest.  No focal neurodeficits. ? ?  Lab Tests: ?I ordered, and personally interpreted labs.  The pertinent results include: Very mild hypokalemia, potassium 3.3 we will orally replete.  CBC unremarkable.  D-dimer negative.  Troponin negative x2.  Magnesium 1.9.  Patient with a heart score of 1. ?  ?Imaging Studies: ?I ordered imaging studies including plain film radiograph of the chest. I independently visualized and interpreted imaging which showed no acute intrathoracic abnormality.. I agree with the radiologist interpretation. ?  ?Cardiac Monitoring:  ?The patient was maintained on a cardiac monitor.  My attending physician Dr. Rush Landmark viewed and interpreted the cardiac monitored which showed an underlying rhythm of: sinus.  Patient with normal PR interval.  He does have some mild upsloping of P waves, however after consultation with Dr. Algie Coffer patient with no appearance of true delta wave or WPW. ? ?Consultations Obtained: ?I requested consultation with the cardiologist, Dr. Algie Coffer,  and discussed lab and imaging findings as well as pertinent plan - they recommend: After discussion with Dr. Precious Gilding a, and further history on patient that he has had for oral intake, fluid intake secondary to depression, recent stress, recent deaths in family, and is very unlikely that patient's symptoms are related to a  cardiogenic cause at this time.  Nonetheless given history of early cardiac death in mother and grandmother, we will have patient follow-up with cardiology in around 3 weeks.  We will not proceed with admission, or echocard

## 2021-11-13 NOTE — Consult Note (Signed)
Referring Physician:Christopher Tegeler, MD ? ?Evan River. is an 21 y.o. male.                       ?Chief Complaint: Syncope ? ?HPI: 21 years old black male with PMH of migraine and seasonal allergy has one episode of syncope. He admits to depression post family problems and death in family. He admits to decreased food and fluid intake for several days. His EKG is essentially normal and troponin I levels are also normal. Blood work is near normal except potassium of 3.3 meq. ? ?Past Medical History:  ?Diagnosis Date  ? Migraines   ? Seasonal allergies   ?  ? ? ?Past Surgical History:  ?Procedure Laterality Date  ? ANKLE SURGERY    ? WISDOM TOOTH EXTRACTION    ? ? ?Family History  ?Problem Relation Age of Onset  ? Heart failure Mother   ? ?Social History:  reports that he has been smoking cigarettes. He has never used smokeless tobacco. He reports current alcohol use. He reports current drug use. Drug: Marijuana. ? ?Allergies:  ?Allergies  ?Allergen Reactions  ? Amoxicillin Rash  ?  Has patient had a PCN reaction causing immediate rash, facial/tongue/throat swelling, SOB or lightheadedness with hypotension: Yes ?Has patient had a PCN reaction causing severe rash involving mucus membranes or skin necrosis: Yes ?Has patient had a PCN reaction that required hospitalization: No ?Has patient had a PCN reaction occurring within the last 10 years: No ?If all of the above answers are "NO", then may proceed with Cephalosporin use. ?  ? ? ?(Not in a hospital admission) ? ? ?Results for orders placed or performed during the hospital encounter of 11/13/21 (from the past 48 hour(s))  ?Basic metabolic panel     Status: Abnormal  ? Collection Time: 11/13/21 10:48 AM  ?Result Value Ref Range  ? Sodium 136 135 - 145 mmol/L  ? Potassium 3.3 (L) 3.5 - 5.1 mmol/L  ? Chloride 100 98 - 111 mmol/L  ? CO2 28 22 - 32 mmol/L  ? Glucose, Bld 93 70 - 99 mg/dL  ?  Comment: Glucose reference range applies only to samples taken after  fasting for at least 8 hours.  ? BUN 8 6 - 20 mg/dL  ? Creatinine, Ser 0.90 0.61 - 1.24 mg/dL  ? Calcium 9.3 8.9 - 10.3 mg/dL  ? GFR, Estimated >60 >60 mL/min  ?  Comment: (NOTE) ?Calculated using the CKD-EPI Creatinine Equation (2021) ?  ? Anion gap 8 5 - 15  ?  Comment: Performed at [facility], [address] [place], [place] [address]  ?CBC     Status: None  ? Collection Time: 11/13/21 10:48 AM  ?Result Value Ref Range  ? WBC 6.8 4.0 - 10.5 K/uL  ? RBC 5.01 4.22 - 5.81 MIL/uL  ? Hemoglobin 14.5 13.0 - 17.0 g/dL  ? HCT 42.9 39.0 - 52.0 %  ? MCV 85.6 80.0 - 100.0 fL  ? MCH 28.9 26.0 - 34.0 pg  ? MCHC 33.8 30.0 - 36.0 g/dL  ? RDW 13.1 11.5 - 15.5 %  ? Platelets 257 150 - 400 K/uL  ? nRBC 0.0 0.0 - 0.2 %  ?  Comment: Performed at [facility], [address], [place], [place] [address]  ?Troponin I (High Sensitivity)     Status: None  ? Collection Time: 11/13/21 10:48 AM  ?Result Value Ref Range  ? Troponin I (  High Sensitivity) 3 <18 ng/L  ?  Comment: (NOTE) ?Elevated high sensitivity troponin I (hsTnI) values and significant  ?changes across serial measurements may suggest ACS but many other  ?chronic and acute conditions are known to elevate hsTnI results.  ?Refer to the "Links" section for chest pain algorithms and additional  ?guidance. ?Performed at [facility], [address], [place], [place] ?[address] ?  ?D-dimer, quantitative     Status: None  ? Collection Time: 11/13/21 10:48 AM  ?Result Value Ref Range  ? D-Dimer, Quant <0.27 0.00 - 0.50 ug/mL-FEU  ?  Comment: (NOTE) ?At the manufacturer cut-off value of 0.5 ?g/mL FEU, this assay has a ?negative predictive value of 95-100%.This assay is intended for use ?in conjunction with a clinical pretest probability (PTP) assessment ?model to exclude pulmonary embolism (PE) and deep venous thrombosis ?(DVT) in outpatients suspected of PE or DVT. ?Results should be correlated with clinical presentation. ?Performed at [facility], [address], [place], [place] ?[address] ?  ? ?DG Chest 2 View ? ?Result Date: 11/13/2021 ?CLINICAL DATA:  21 year old male with chest pain, syncope. EXAM: CHEST - 2 VIEW COMPARISON:  Chest radiographs 09/22/2019. FINDINGS: Larger lung volumes today. Both lungs appear clear. No pneumothorax or pleural effusion. Normal cardiac size and mediastinal contours. Visualized tracheal air column is within normal limits. No acute osseous abnormality identified. Negative visible bowel gas. IMPRESSION: Negative.  No cardiopulmonary abnormality. Electronically Signed   By: Odessa Fleming M.D.   On: 11/13/2021 11:24   ? ?Review Of Systems ?Constitutional: No fever, chills, weight loss or gain. ?Eyes: No vision change, No discharge or pain. ?Ears: No hearing loss, No tinnitus. ?Respiratory: No asthma, COPD, pneumonias. No shortness of breath. No hemoptysis. ?Cardiovascular: Occasional chest pain, no palpitation, leg edema. ?Gastrointestinal: No nausea, vomiting, diarrhea, constipation. No GI bleed. No hepatitis. ?Genitourinary: No dysuria, hematuria, kidney stone. No incontinance. ?Neurological: No headache, stroke, seizures.  ?Psychiatry: No psych facility admission for anxiety, depression, suicide. No detox. ?Skin: No rash. ?Musculoskeletal: No joint pain, fibromyalgia. No neck pain, back pain. ?Lymphadenopathy: No lymphadenopathy. ?Hematology: No anemia or easy bruising. ? ? ?Blood pressure 114/65, pulse 67, temperature 98.4 ?F (36.9 ?C), temperature source Oral, resp. rate 16, height 5\' 6"  (1.676 m), weight 83.9 kg, SpO2 100 %. Body mass index is 29.86 kg/m?. ?General appearance: alert, cooperative, appears stated age and no distress ?Head: Normocephalic, atraumatic. ?Eyes: Brown eyes, pink conjunctiva, corneas clear.  ?Neck: No adenopathy, no carotid bruit, no JVD, supple, symmetrical, trachea midline and thyroid not enlarged. ?Resp: Clear to auscultation bilaterally. ?Cardio: Regular rate and rhythm, S1, S2 normal, II/VI systolic murmur,  no click, rub or gallop ?GI: Soft, non-tender; bowel sounds normal; no organomegaly. ?Extremities: No edema, cyanosis or clubbing. ?Skin: Warm and dry.  ?Neurologic: Alert and oriented X 3, normal strength. Normal coordination. ? ?Assessment/Plan ?Syncope, vasovagal ?Chest pain ?Depression ? ?Plan: ?Patient agrees to eat 3 meals a day and drink at least 5 glasses of fluids per day. ?F/U in 2-4 weeks at office. ?See psychiatrist or psychologist as needed. ? ?Time spent: Review of old records, Lab, x-rays, EKG, other cardiac tests, examination, discussion with patient/ER doctor over 60 minutes. ? ? , MD ? ?11/13/2021, 12:59 PM ? ? ? ?

## 2021-11-13 NOTE — Discharge Instructions (Signed)
As we discussed I think that your lightheadedness, and syncopal episodes were secondary to decreased oral intake.  I encourage you to drink plenty of fluids, eat 2-3 meals a day.  If you have been struggling with depression, stress, decreased mood that has been affecting your appetite I recommend that you reach out to your therapist for additional resources.  If you have any thoughts of hurting yourself or hurting anyone else please return to the emergency department for further evaluation.  If you have return of lightheadedness, passing out, or chest pain please return to the ED. Follow up with cardiology in 3 weeks as needed. ?

## 2021-11-13 NOTE — ED Provider Triage Note (Signed)
Emergency Medicine Provider Triage Evaluation Note ? ?Evan Small , a 21 y.o. male  was evaluated in triage.  Pt complains of having episode of lightheadedness, syncope, with chest pain this morning.  Patient reports that he woke up like normal, was standing up ready for his day when he suddenly felt quite lightheaded, passed out and woke up on the ground.  Patient denies any significant injury, head pain from the fall.  Patient reports that since then he had some pressure in the chest that is worse with inspiration.  Patient denies any personal history of blood clots but endorses family history of blood clots.  Patient does endorse some early cardiac death in his family with mother dying at 57 of cardiac arrest, grandmother dying at 60 of cardiac arrest.  No known diagnosis of HOCM, or other fatal arrhythmia.. ? ?Review of Systems  ?Positive: Chest pain, lightheadedness ?Negative: NV, seizure activity, post-ictal period ? ?Physical Exam  ?BP (!) 141/87 (BP Location: Right Arm)   Pulse 77   Temp 98.4 ?F (36.9 ?C) (Oral)   Resp 16   Ht 5\' 6"  (1.676 m)   Wt 83.9 kg   SpO2 99%   BMI 29.86 kg/m?  ?Gen:   Awake, no distress   ?Resp:  Normal effort  ?MSK:   Moves extremities without difficulty  ?Other:  TTP of central / left sided chest wall ? ?Medical Decision Making  ?Medically screening exam initiated at 10:45 AM.  Appropriate orders placed.  . was informed that the remainder of the evaluation will be completed by another provider, this initial triage assessment does not replace that evaluation, and the importance of remaining in the ED until their evaluation is complete. ? ?Workup initiated ?  ?Evan River, PA-C ?11/13/21 1048 ? ?

## 2021-12-11 ENCOUNTER — Ambulatory Visit (HOSPITAL_COMMUNITY)
Admission: EM | Admit: 2021-12-11 | Discharge: 2021-12-11 | Disposition: A | Discharge status: Home / Self Care | Payer: Medicaid Other | Attending: Psychiatry | Admitting: Psychiatry

## 2021-12-11 DIAGNOSIS — F1420 Cocaine dependence, uncomplicated: Secondary | ICD-10-CM | POA: Insufficient documentation

## 2021-12-11 DIAGNOSIS — F32A Depression, unspecified: Secondary | ICD-10-CM

## 2021-12-11 DIAGNOSIS — F17210 Nicotine dependence, cigarettes, uncomplicated: Secondary | ICD-10-CM | POA: Insufficient documentation

## 2021-12-11 DIAGNOSIS — F1220 Cannabis dependence, uncomplicated: Secondary | ICD-10-CM | POA: Insufficient documentation

## 2021-12-11 DIAGNOSIS — F1010 Alcohol abuse, uncomplicated: Secondary | ICD-10-CM | POA: Insufficient documentation

## 2021-12-11 DIAGNOSIS — F4323 Adjustment disorder with mixed anxiety and depressed mood: Secondary | ICD-10-CM | POA: Insufficient documentation

## 2021-12-11 DIAGNOSIS — Z9151 Personal history of suicidal behavior: Secondary | ICD-10-CM | POA: Insufficient documentation

## 2021-12-11 DIAGNOSIS — Z87898 Personal history of other specified conditions: Secondary | ICD-10-CM

## 2021-12-11 NOTE — Discharge Instructions (Addendum)
For your behavioral health needs you are advised to follow up with one of the providers listed below at your earliest opportunity: ? ?     [facility] ?     [address] ?     [place], [place] [address] ?     [phone] ?     They offer psychiatry/medication management, therapy and substance use disorder treatment.  New patients are seen in their walk-in clinic.  Walk-in hours are Monday, Wednesday, Thursday and Friday from 8:00 am - 11:00 am for psychiatry, and Monday and Wednesday from 8:00 am - 11:00 am for therapy.  Walk-in patients are seen on a first come, first served basis, so try to arrive as early as possible for the best chance of being seen the same day.  Please note that to be eligible for services you must bring an ID or a piece of mail with your name and a [place] address. ? ?     [facility] ?     [address] ?     [place], [place] [address] ?     [phone]  ?     New patients are seen at their walk-in clinic.  Walk-in hours are Monday - Friday from 8:30 am - 12:00 pm, and from 1:00 pm - 2:30 pm.  Walk-in patients are seen on a first come, first served basis, so try to arrive as early as possible for the best chance of being seen the same day. ? ?     [facility] ?     [address] ?     [place], [place] [address] ?     [phone] ?
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
## 2021-12-11 NOTE — ED Notes (Signed)
Pt discharged with  AVS.  AVS reviewed prior to discharge.  Pt alert, oriented, and ambulatory.  Safety maintained.  °

## 2021-12-12 ENCOUNTER — Emergency Department (HOSPITAL_COMMUNITY)
Admission: EM | Admit: 2021-12-12 | Discharge: 2021-12-13 | Discharge status: Against Medical Advice | Payer: Medicaid Other

## 2021-12-12 ENCOUNTER — Other Ambulatory Visit: Payer: Self-pay

## 2021-12-12 NOTE — ED Notes (Signed)
Called pt multiple times for vitals and triage with no answer  ?

## 2021-12-22 ENCOUNTER — Ambulatory Visit (HOSPITAL_COMMUNITY)
Admission: EM | Admit: 2021-12-22 | Discharge: 2021-12-22 | Disposition: A | Discharge status: Home / Self Care | Payer: Medicaid Other

## 2021-12-22 NOTE — ED Triage Notes (Signed)
Per triage staff Evan Small denies SI / HI / AVH and is refusing to be seen by a provider. Pt is 21 yr old adult who responds appropriate to questions being alert and orient x4 and not under IVC AMA papers signed and pt has left the building. ?

## 2021-12-22 NOTE — ED Provider Notes (Signed)
Evan Small,  21 y.o male was seen by TTS,  however pt leave without seen the provider,  Per TTS,  pt didn't want to be seen, he just want to talk to someone and leave.   ?

## 2021-12-22 NOTE — Progress Notes (Signed)
?   12/22/21 2247  ?Patient Reported Information  ?How Did You Hear About [place]? Self  ?What Is the Reason for Your Visit/Call Today? Clinician asked the pt, "what brought you to [facility]?" Pt reports, he came because his grandfather is in the hospital due to kidney failure and isn't doing good. Pt reports, he's sad. Pt denies, SI, HI, AVH, self-injurious behaviors. Pt reports, he has access to a pistol and a ARP but would not answer the if the guns are registered to him. Clinician expressed, the questions that are asked are to determine the need of care. Clinician provided examples of whenconfidentiality could be broken. Pt apologized for "wasting our time," and expressed he wanted to leave and come back tomorrow with his sister. Clinician expressed his triage is completed, he's going to be roomed to see the provider. Pt continued to express wanting to leave. Clinician expressed that by leaving before seeing a provider, he'll be leaving at is own risk (pt signed MSE.) Pt expressed understanding, he was going to walk home (to his fathers' house.) Pt reports, he can contract for safety when discharged.  ?How Long Has This Been Causing You Problems? <Week  ?What Do You Feel Would Help You the Most Today? Treatment for Depression or other mood problem  ?Are You Planning to Commit Suicide/Harm Yourself At This time? No ?(Pt denies.)  ?Have you Recently Had Thoughts About [place]? No ?(Pt denies.)  ?Are You Planning To Harm Someone At This Time? No ?(Pt denies.)  ?Have You Used Any Alcohol or Drugs in the Past 24 Hours? Yes  ?What Did You Use and How Much? UTA  ?Do You Currently Have a Therapist/Psychiatrist?  ?(Pt reports, he has a therapy appointment next month upstairs at [facility].)  ?CCA Screening Triage Referral Assessment  ?Type of Contact Face-to-Face  ?Location of Assessment [facility] Assessment Services  ?Provider location [facility] Assessment Services  ?Collateral Involvement Pt reports, he will have his  sister accompany his tomorrow to [facility].  ?Patient Determined To Be At Risk for Harm To Self or Others Based on Review of Patient Reported Information or Presenting Complaint? No  ?Does Patient Present under Involuntary Commitment? No  ?[place]  ?Patient Currently Receiving the Following Services: Not Receiving Services  ?Determination of Need Routine (7 days)  ?Options For Referral Medication Management;Outpatient Therapy  ? ? ?Determination of need: Routine.  ? ? ? ?Vertell Novak, MS, [facility], [facility] ?Triage Specialist ?[phone] ? ?

## 2022-01-05 ENCOUNTER — Ambulatory Visit (INDEPENDENT_AMBULATORY_CARE_PROVIDER_SITE_OTHER): Payer: Medicaid Other | Admitting: Clinical

## 2022-01-05 DIAGNOSIS — F331 Major depressive disorder, recurrent, moderate: Secondary | ICD-10-CM | POA: Diagnosis not present

## 2022-01-10 ENCOUNTER — Encounter (HOSPITAL_COMMUNITY): Payer: Self-pay
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
## 2022-01-10 NOTE — Plan of Care (Signed)
?  Problem: Depression CCP Problem  1  ?Goal: LTG: Evan Small WILL SCORE LESS THAN 10 ON THE PATIENT HEALTH QUESTIONNAIRE (PHQ-9) ?Outcome: Not Progressing ?Goal: STG: Reduce overall depression score by a minimum of 25% on the Patient Health Questionnaire (PHQ-9) or the Montgomery-Asberg Depression Rating Scale (MADRS) ?Outcome: Not Progressing ?Goal: STG: Evan Small WILL IDENTIFY 3 COGNITIVE PATTERNS AND BELIEFS THAT SUPPORT DEPRESSION ?Outcome: Not Progressing ?  ?

## 2022-02-11 ENCOUNTER — Telehealth: Payer: Self-pay | Admitting: *Deleted

## 2022-02-11 NOTE — Telephone Encounter (Signed)
error 

## 2022-03-15 ENCOUNTER — Ambulatory Visit (HOSPITAL_COMMUNITY): Payer: Medicaid Other | Admitting: Clinical

## 2022-03-29 ENCOUNTER — Ambulatory Visit (HOSPITAL_COMMUNITY): Payer: Self-pay | Admitting: Clinical

## 2022-06-18 ENCOUNTER — Ambulatory Visit (HOSPITAL_COMMUNITY)
Admission: EM | Admit: 2022-06-18 | Discharge: 2022-06-18 | Disposition: A | Discharge status: Home / Self Care | Payer: Medicaid Other | Attending: Emergency Medicine | Admitting: Emergency Medicine

## 2022-06-18 ENCOUNTER — Encounter (HOSPITAL_COMMUNITY): Payer: Self-pay | Admitting: Emergency Medicine

## 2022-06-18 ENCOUNTER — Ambulatory Visit (INDEPENDENT_AMBULATORY_CARE_PROVIDER_SITE_OTHER): Payer: Medicaid Other

## 2022-06-18 DIAGNOSIS — J45901 Unspecified asthma with (acute) exacerbation: Secondary | ICD-10-CM | POA: Diagnosis not present

## 2022-06-18 DIAGNOSIS — R0602 Shortness of breath: Secondary | ICD-10-CM

## 2022-06-18 DIAGNOSIS — J302 Other seasonal allergic rhinitis: Secondary | ICD-10-CM

## 2022-06-18 MED ORDER — ALBUTEROL SULFATE HFA 108 (90 BASE) MCG/ACT IN AERS
1.0000 | INHALATION_SPRAY | RESPIRATORY_TRACT | 2 refills | Status: AC | PRN
Start: 2022-06-18 — End: ?

## 2022-06-18 MED ORDER — PREDNISONE 20 MG PO TABS: 40.0000 mg | ORAL_TABLET | Freq: Every day | ORAL | 0 refills | Status: AC

## 2022-06-18 MED ORDER — FLUTICASONE PROPIONATE 50 MCG/ACT NA SUSP: 2.0000 | Freq: Every day | NASAL | 2 refills | Status: AC

## 2022-06-18 MED ORDER — IPRATROPIUM-ALBUTEROL 0.5-2.5 (3) MG/3ML IN SOLN
3.0000 mL | Freq: Once | RESPIRATORY_TRACT | Status: AC
  Administered 2022-06-18: 3 mL via RESPIRATORY_TRACT

## 2022-06-18 MED ORDER — IPRATROPIUM-ALBUTEROL 0.5-2.5 (3) MG/3ML IN SOLN
RESPIRATORY_TRACT | Status: AC
  Filled 2022-06-18: qty 3

## 2022-06-18 MED ORDER — CETIRIZINE HCL 10 MG PO TABS: 10.0000 mg | ORAL_TABLET | Freq: Every day | ORAL | 2 refills | Status: AC

## 2022-06-18 NOTE — ED Provider Notes (Signed)
[facility]    CSN: [id number] Arrival date & time: 06/18/22  1009     History   Chief Complaint Chief Complaint  Patient presents with   Otalgia   Cough    HPI Dennys Guin. is a 21 y.o. male.  Presents with 2 month history of chest tightness and shortness of breath Reports tightness causes trouble taking deep breath History of asthma, only uses albuterol inhaler.  He has been using it much more frequently lately without relief  Some nasal congestion, sinus pressure, left ear pressure over the last week Productive cough with green sputum Denies any fevers  No known sick contacts Has not taken medications for his symptoms  Past Medical History:  Diagnosis Date   Migraines    Seasonal allergies     Patient Active Problem List   Diagnosis Date Noted   Major depressive disorder, single episode, severe ([place])     Past Surgical History:  Procedure Laterality Date   ANKLE SURGERY     WISDOM TOOTH EXTRACTION         Home Medications    Prior to Admission medications   Medication Sig Start Date End Date Taking? Authorizing Provider  cetirizine (ZYRTEC ALLERGY) 10 MG tablet Take 1 tablet (10 mg total) by mouth daily. 06/18/22  Yes Tania Perrott, Wells Guiles, PA-C  predniSONE (DELTASONE) 20 MG tablet Take 2 tablets (40 mg total) by mouth daily with breakfast for 5 days. 06/18/22 06/23/22 Yes Wendy Mikles, Wells Guiles, PA-C  albuterol (VENTOLIN HFA) 108 (90 Base) MCG/ACT inhaler Inhale 1 puff into the lungs every 4 (four) hours as needed for wheezing or shortness of breath. 06/18/22   Elpidia Karn, Wells Guiles, PA-C  escitalopram (LEXAPRO) 10 MG tablet Take 1 tablet (10 mg total) by mouth daily. 10/26/21   Revonda Humphrey, NP  fluticasone (FLONASE) 50 MCG/ACT nasal spray Place 2 sprays into both nostrils daily. 06/18/22   Zurri Rudden, Wells Guiles, PA-C    Family History Family History  Problem Relation Age of Onset   Heart failure Mother     Social History Social History   Tobacco  Use   Smoking status: Some Days    Types: Cigarettes   Smokeless tobacco: Never   Tobacco comments:    black n mild occ  Substance Use Topics   Alcohol use: Yes    Comment: occ   Drug use: Yes    Types: Marijuana     Allergies   Amoxicillin   Review of Systems Review of Systems  HENT:  Positive for ear pain.   Respiratory:  Positive for cough.    Per HPI  Physical Exam Triage Vital Signs ED Triage Vitals  Enc Vitals Group     BP 06/18/22 1102 121/73     Pulse Rate 06/18/22 1102 72     Resp 06/18/22 1102 16     Temp 06/18/22 1102 98.1 F (36.7 C)     Temp Source 06/18/22 1102 Oral     SpO2 06/18/22 1102 98 %     Weight --      Height --      Head Circumference --      Peak Flow --      Pain Score 06/18/22 1101 8     Pain Loc --      Pain Edu? --      Excl. in [place]? --    No data found.  Updated Vital Signs BP 121/73 (BP Location: Right Arm)   Pulse 72  Temp 98.1 F (36.7 C) (Oral)   Resp 16   SpO2 98%   Physical Exam Vitals and nursing note reviewed.  Constitutional:      General: He is not in acute distress. HENT:     Right Ear: Tympanic membrane and ear canal normal.     Left Ear: Tympanic membrane and ear canal normal.     Nose: Congestion present. No rhinorrhea.     Mouth/Throat:     Pharynx: Oropharynx is clear. No posterior oropharyngeal erythema.  Eyes:     Conjunctiva/sclera: Conjunctivae normal.  Cardiovascular:     Rate and Rhythm: Normal rate and regular rhythm.  Pulmonary:     Effort: Pulmonary effort is normal.     Breath sounds: No decreased air movement. Decreased breath sounds present.  Musculoskeletal:     Cervical back: Normal range of motion.  Neurological:     Mental Status: He is alert and oriented to person, place, and time.      UC Treatments / Results  Labs (all labs ordered are listed, but only abnormal results are displayed) Labs Reviewed - No data to display  EKG   Radiology DG Chest 2 View  Result  Date: 06/18/2022 CLINICAL DATA:  Shortness of breath. EXAM: CHEST - 2 VIEW COMPARISON:  Chest radiograph 11/13/2021 FINDINGS: No pleural effusion. No pneumothorax. No focal airspace opacity. Normal cardiac and mediastinal contours. No displaced rib fractures. Vertebral body heights are maintained. Visualized upper abdomen is unremarkable. IMPRESSION: No radiographic finding to explain shortness of breath. No evidence of pulmonary hyperinflation. Electronically Signed   By: Lorenza Cambridge M.D.   On: 06/18/2022 12:07    Procedures Procedures  Medications Ordered in UC Medications  ipratropium-albuterol (DUONEB) 0.5-2.5 (3) MG/3ML nebulizer solution 3 mL (3 mLs Nebulization Given 06/18/22 1203)    Initial Impression / Assessment and Plan / UC Course  I have reviewed the triage vital signs and the nursing notes.  Pertinent labs & imaging results that were available during my care of the patient were reviewed by me and considered in my medical decision making (see chart for details).  Chest x-ray negative DuoNeb given with much improvement in patient chest tightness.  He feels he is able to take deep breaths now. Low concern for cardiac cause at this time Likely asthma exacerbation due to seasonal allergies Patient overall well-appearing, oxygenating well  Recommend daily allergy medicine with Flonase for nasal congestion, sinus pressure, ear pressure Refilled inhaler to use as needed Prednisone 40 mg daily for the next 5 days to prevent further exacerbation  Recommend patient call primary care, schedule appointment as soon as able for asthma follow-up, discussed he may need stepup therapy Return precautions discussed. Patient agrees to plan  Final Clinical Impressions(s) / UC Diagnoses   Final diagnoses:  Mild asthma with acute exacerbation, unspecified whether persistent  Seasonal allergies     Discharge Instructions      Take the prednisone (oral steroid) as prescribed.  I have  refilled your inhaler. Please use if needed every 6 hours.  I recommend taking the daily allergy medicine in combination with the daily nasal spray to help nasal congestion, sinus pressure, and ear pressure.  Please follow up with your primary care provider regarding symptoms and asthma management.    ED Prescriptions     Medication Sig Dispense Auth. Provider   albuterol (VENTOLIN HFA) 108 (90 Base) MCG/ACT inhaler Inhale 1 puff into the lungs every 4 (four) hours as needed for wheezing or  shortness of breath. 18 g Marine Lezotte, PA-C   fluticasone (FLONASE) 50 MCG/ACT nasal spray Place 2 sprays into both nostrils daily. 9.9 mL Johnson Arizola, PA-C   cetirizine (ZYRTEC ALLERGY) 10 MG tablet Take 1 tablet (10 mg total) by mouth daily. 30 tablet Dolton Shaker, PA-C   predniSONE (DELTASONE) 20 MG tablet Take 2 tablets (40 mg total) by mouth daily with breakfast for 5 days. 10 tablet [place] Favor, Lurena Joiner, PA-C      PDMP not reviewed this encounter.   Malayah Demuro, Lurena Joiner, PA-C 06/18/22 1255

## 2022-06-18 NOTE — Discharge Instructions (Addendum)
Take the prednisone (oral steroid) as prescribed.  I have refilled your inhaler. Please use if needed every 6 hours.  I recommend taking the daily allergy medicine in combination with the daily nasal spray to help nasal congestion, sinus pressure, and ear pressure.  Please follow up with your primary care provider regarding symptoms and asthma management.

## 2022-06-18 NOTE — ED Triage Notes (Signed)
Left ear pain since last Sunday.   Pt has cough that is productive with greenish mucous. Reports using inhaler. Has nasal congestion as well. Reports that hard to breath esp when trying to run. These symptoms been ongoing for a couple months.

## 2022-07-10 IMAGING — CR DG CHEST 2V
2 series · 2 of 2 positions shown · non-contrast
Comparison: 11/27/2020

CLINICAL DATA: Chest pain and shortness of breath.

EXAM:
CHEST - 2 VIEW

[chest pa]
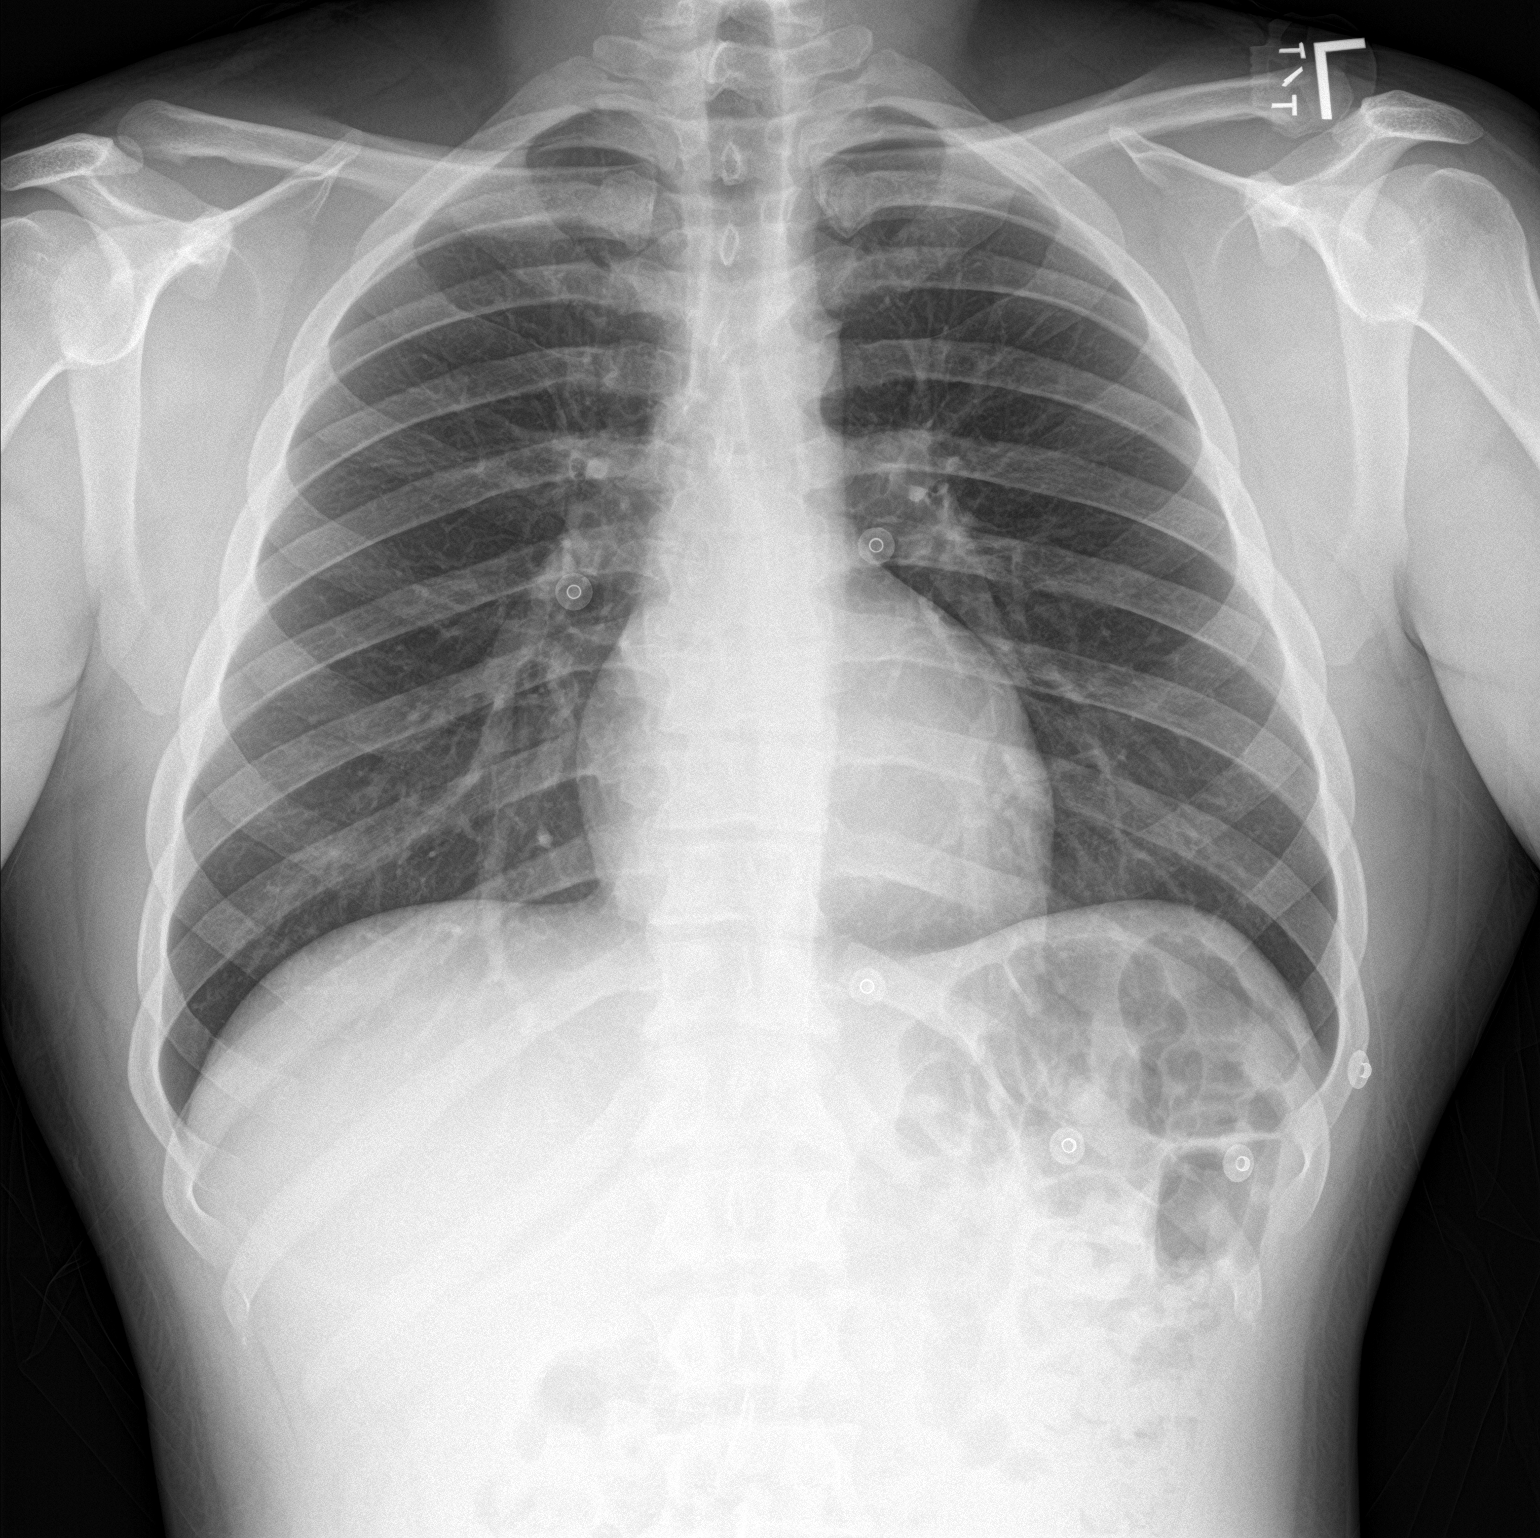

[chest lat]
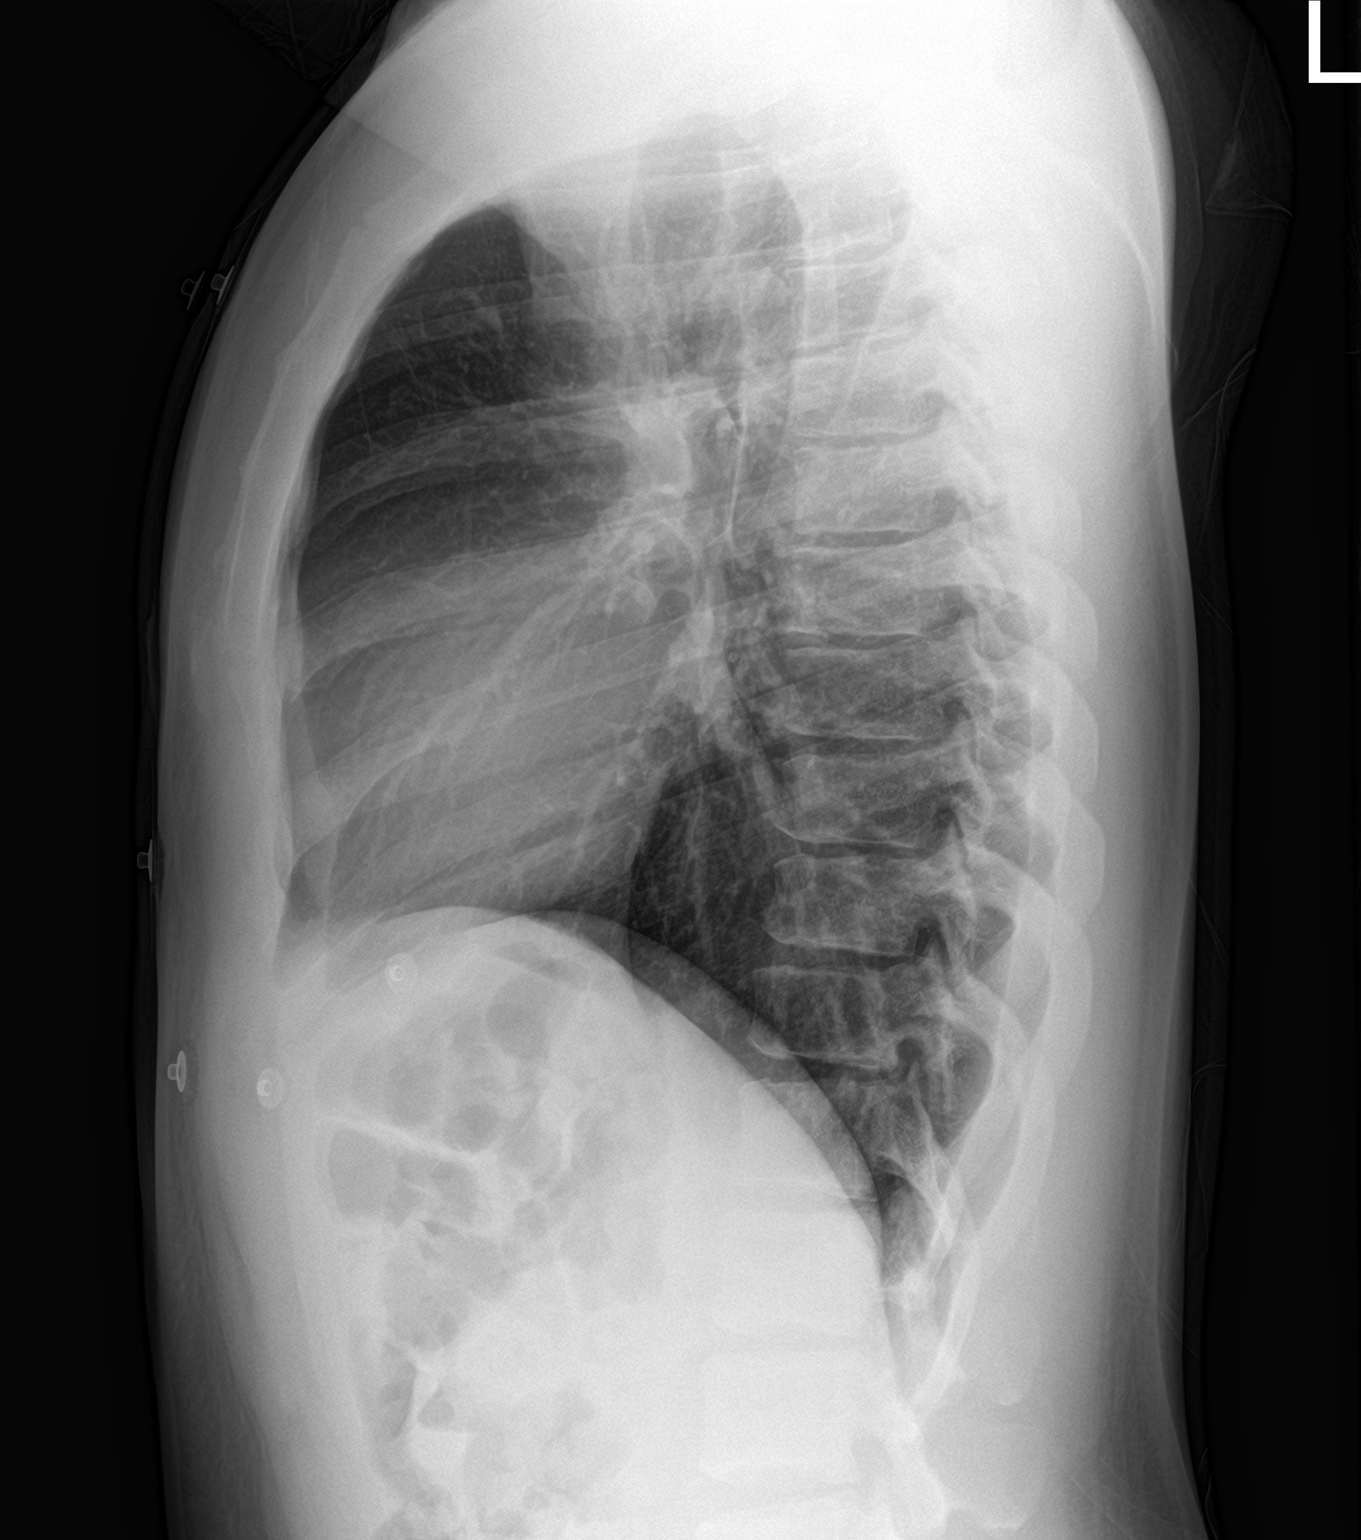

[2 of 2 positions shown; findings below may reference images not displayed]

FINDINGS: The cardiomediastinal contours are normal. The lungs are clear.
Pulmonary vasculature is normal. No consolidation, pleural effusion,
or pneumothorax. No acute osseous abnormalities are seen.
IMPRESSION: Negative radiographs of the chest.

## 2023-01-25 ENCOUNTER — Emergency Department (HOSPITAL_BASED_OUTPATIENT_CLINIC_OR_DEPARTMENT_OTHER)
Admission: EM | Admit: 2023-01-25 | Discharge: 2023-01-25 | Disposition: A | Discharge status: Home / Self Care | Payer: Medicaid Other | Attending: Emergency Medicine | Admitting: Emergency Medicine

## 2023-01-25 ENCOUNTER — Other Ambulatory Visit: Payer: Self-pay

## 2023-01-25 ENCOUNTER — Emergency Department (HOSPITAL_BASED_OUTPATIENT_CLINIC_OR_DEPARTMENT_OTHER): Payer: Medicaid Other

## 2023-01-25 ENCOUNTER — Encounter (HOSPITAL_BASED_OUTPATIENT_CLINIC_OR_DEPARTMENT_OTHER): Payer: Self-pay | Admitting: Emergency Medicine

## 2023-01-25 DIAGNOSIS — R109 Unspecified abdominal pain: Secondary | ICD-10-CM | POA: Diagnosis not present

## 2023-01-25 LAB — COMPREHENSIVE METABOLIC PANEL WITH GFR
ALT: 13 U/L (ref 0–44)
AST: 15 U/L (ref 15–41)
Albumin: 4.2 g/dL (ref 3.5–5.0)
Alkaline Phosphatase: 64 U/L (ref 38–126)
Anion gap: 5 (ref 5–15)
BUN: 10 mg/dL (ref 6–20)
CO2: 26 mmol/L (ref 22–32)
Calcium: 9.2 mg/dL (ref 8.9–10.3)
Chloride: 107 mmol/L (ref 98–111)
Creatinine, Ser: 0.78 mg/dL (ref 0.61–1.24)
GFR, Estimated: >60 mL/min
Glucose, Bld: 92 mg/dL (ref 70–99)
Potassium: 3.9 mmol/L (ref 3.5–5.1)
Sodium: 138 mmol/L (ref 135–145)
Total Bilirubin: 1.3 mg/dL — ABNORMAL HIGH (ref 0.3–1.2)
Total Protein: 6.5 g/dL (ref 6.5–8.1)

## 2023-01-25 LAB — CBC
HCT: 40 % (ref 39.0–52.0)
Hemoglobin: 14.1 g/dL (ref 13.0–17.0)
MCH: 29.4 pg (ref 26.0–34.0)
MCHC: 35.3 g/dL (ref 30.0–36.0)
MCV: 83.3 fL (ref 80.0–100.0)
Platelets: 205 10*3/uL (ref 150–400)
RBC: 4.8 MIL/uL (ref 4.22–5.81)
RDW: 13.2 % (ref 11.5–15.5)
WBC: 4.2 10*3/uL (ref 4.0–10.5)
nRBC: 0 % (ref 0.0–0.2)

## 2023-01-25 LAB — URINALYSIS, ROUTINE W REFLEX MICROSCOPIC
Bilirubin Urine: NEGATIVE
Glucose, UA: NEGATIVE mg/dL
Hgb urine dipstick: NEGATIVE
Ketones, ur: NEGATIVE mg/dL
Leukocytes,Ua: NEGATIVE
Nitrite: NEGATIVE
Protein, ur: NEGATIVE mg/dL
Specific Gravity, Urine: >1.046 — ABNORMAL HIGH (ref 1.005–1.030)
pH: 8 (ref 5.0–8.0)

## 2023-01-25 LAB — LIPASE, BLOOD: Lipase: 15 U/L (ref 11–51)

## 2023-01-25 MED ORDER — FAMOTIDINE 20 MG PO TABS
20.0000 mg | ORAL_TABLET | Freq: Once | ORAL | Status: AC
  Administered 2023-01-25: 20 mg via ORAL
  Filled 2023-01-25: qty 1

## 2023-01-25 MED ORDER — DICYCLOMINE HCL 20 MG PO TABS: 20.0000 mg | ORAL_TABLET | Freq: Two times a day (BID) | ORAL | 0 refills | Status: AC | PRN

## 2023-01-25 MED ORDER — ALUM & MAG HYDROXIDE-SIMETH 200-200-20 MG/5ML PO SUSP
15.0000 mL | Freq: Once | ORAL | Status: AC
  Administered 2023-01-25: 15 mL via ORAL
  Filled 2023-01-25: qty 30

## 2023-01-25 MED ORDER — IOHEXOL 300 MG/ML  SOLN
100.0000 mL | Freq: Once | INTRAMUSCULAR | Status: AC | PRN
  Administered 2023-01-25: 80 mL via INTRAVENOUS

## 2023-01-25 NOTE — ED Triage Notes (Signed)
Pt arrives to ED with c/o x1 month of abdominal pain and elevated bilirubin.

## 2023-01-25 NOTE — ED Provider Notes (Signed)
[place] EMERGENCY DEPARTMENT AT [facility] Provider Note   CSN: [id number] Arrival date & time: 01/25/23  1259     History  Chief Complaint  Patient presents with   Abdominal Pain    Evan Buchanan. is a 22 y.o. male.   Abdominal Pain   22 year old male presents emergency department with complaints of abdominal pain as well as elevation in his bilirubin.  Patient states that he has been with abdominal pain for the past 2 to 3 months.  Describes pain as sharp/stabbing in nature.  Reports relief with "smoking blunts" and denies any known exacerbation of symptoms.  Has been seen by primary care in the outpatient setting for said symptoms and has upcoming appointment with gastroenterology.  Presents emergency department for further assessment.  Denies any fever, nausea, vomiting, urinary symptoms, change in bowel habits.  Past med history significant for migraines, seasonal allergy, major depressive disorder  Home Medications Prior to Admission medications   Medication Sig Start Date End Date Taking? Authorizing Provider  dicyclomine (BENTYL) 20 MG tablet Take 1 tablet (20 mg total) by mouth 2 (two) times daily as needed for spasms. 01/25/23  Yes Sherian Maroon A, PA  albuterol (VENTOLIN HFA) 108 (90 Base) MCG/ACT inhaler Inhale 1 puff into the lungs every 4 (four) hours as needed for wheezing or shortness of breath. 06/18/22   Rising, Lurena Joiner, PA-C  cetirizine (ZYRTEC ALLERGY) 10 MG tablet Take 1 tablet (10 mg total) by mouth daily. 06/18/22   Rising, Lurena Joiner, PA-C  escitalopram (LEXAPRO) 10 MG tablet Take 1 tablet (10 mg total) by mouth daily. 10/26/21   Ardis Hughs, NP  fluticasone (FLONASE) 50 MCG/ACT nasal spray Place 2 sprays into both nostrils daily. 06/18/22   Rising, Lurena Joiner, PA-C      Allergies    Amoxicillin    Review of Systems   Review of Systems  Gastrointestinal:  Positive for abdominal pain.  All other systems reviewed and are  negative.   Physical Exam Updated Vital Signs BP 130/71 (BP Location: Left Arm)   Pulse 81   Temp 98.7 F (37.1 C) (Oral)   Resp 18   Ht 5\' 6"  (1.676 m)   Wt 101.2 kg   SpO2 100%   BMI 35.99 kg/m  Physical Exam Vitals and nursing note reviewed.  Constitutional:      General: He is not in acute distress.    Appearance: He is well-developed.  HENT:     Head: Normocephalic and atraumatic.  Eyes:     Conjunctiva/sclera: Conjunctivae normal.  Cardiovascular:     Rate and Rhythm: Normal rate and regular rhythm.     Heart sounds: No murmur heard. Pulmonary:     Effort: Pulmonary effort is normal. No respiratory distress.     Breath sounds: Normal breath sounds.  Abdominal:     Palpations: Abdomen is soft.     Tenderness: There is abdominal tenderness in the epigastric area. There is no right CVA tenderness or left CVA tenderness. Negative signs include Murphy's sign and McBurney's sign.     Comments: Mild epigastric tenderness to palpation with just left of midline tenderness to palpation in epigastric region  Musculoskeletal:        General: No swelling.     Cervical back: Neck supple.  Skin:    General: Skin is warm and dry.     Capillary Refill: Capillary refill takes less than 2 seconds.  Neurological:     Mental Status: He is alert.  Psychiatric:        Mood and Affect: Mood normal.     ED Results / Procedures / Treatments   Labs (all labs ordered are listed, but only abnormal results are displayed) Labs Reviewed  COMPREHENSIVE METABOLIC PANEL - Abnormal; Notable for the following components:      Result Value   Total Bilirubin 1.3 (*)    All other components within normal limits  URINALYSIS, ROUTINE W REFLEX MICROSCOPIC - Abnormal; Notable for the following components:   Color, Urine COLORLESS (*)    Specific Gravity, Urine >1.046 (*)    All other components within normal limits  LIPASE, BLOOD  CBC    EKG None  Radiology CT ABDOMEN PELVIS W  CONTRAST  Result Date: 01/25/2023 CLINICAL DATA:  One-month history of abdominal pain and elevated bilirubin EXAM: CT ABDOMEN AND PELVIS WITH CONTRAST TECHNIQUE: Multidetector CT imaging of the abdomen and pelvis was performed using the standard protocol following bolus administration of intravenous contrast. RADIATION DOSE REDUCTION: This exam was performed according to the departmental dose-optimization program which includes automated exposure control, adjustment of the mA and/or kV according to patient size and/or use of iterative reconstruction technique. CONTRAST:  80mL OMNIPAQUE IOHEXOL 300 MG/ML  SOLN COMPARISON:  None Available. FINDINGS: Lower chest: No focal consolidation or pulmonary nodule in the lung bases. No pleural effusion or pneumothorax demonstrated. Partially imaged heart size is normal. Hepatobiliary: No focal hepatic lesions. No intra or extrahepatic biliary ductal dilation. Normal gallbladder. Pancreas: Pancreatic lipomatosis. No main ductal dilation or focal mass lesion. Spleen: Normal in size without focal abnormality. Adrenals/Urinary Tract: No adrenal nodules. No suspicious renal mass, calculi or hydronephrosis. No focal bladder wall thickening. Stomach/Bowel: Normal appearance of the stomach. No evidence of bowel wall thickening, distention, or inflammatory changes. Normal appendix. Vascular/Lymphatic: No significant vascular findings are present. No enlarged abdominal or pelvic lymph nodes. Reproductive: Prostate is unremarkable. Other: No free fluid, fluid collection, or free air. Musculoskeletal: No acute or abnormal lytic or blastic osseous lesions. IMPRESSION: 1. No acute abdominopelvic findings. 2. Pancreatic lipomatosis. No main ductal dilation or focal mass lesion. Electronically Signed   By: Agustin Cree M.D.   On: 01/25/2023 14:41    Procedures Procedures    Medications Ordered in ED Medications  alum & mag hydroxide-simeth (MAALOX/MYLANTA) 200-200-20 MG/5ML suspension  15 mL (15 mLs Oral Given 01/25/23 1454)  famotidine (PEPCID) tablet 20 mg (20 mg Oral Given 01/25/23 1454)  iohexol (OMNIPAQUE) 300 MG/ML solution 100 mL (80 mLs Intravenous Contrast Given 01/25/23 1420)    ED Course/ Medical Decision Making/ A&P Clinical Course as of 01/25/23 1715  Tue Jan 25, 2023  1404 2-3 months abdominal pian  [CR]    Clinical Course User Index [CR] Peter Garter, PA                             Medical Decision Making Amount and/or Complexity of Data Reviewed Labs: ordered. Radiology: ordered.  Risk OTC drugs. Prescription drug management.   This patient presents to the ED for concern of abdominal pain, this involves an extensive number of treatment options, and is a complaint that carries with it a high risk of complications and morbidity.  The differential diagnosis includes gastritis, PUD, pancreatitis, CBD pathology, cholecystitis, SBO/LBO, volvulus, diverticulitis, appendicitis, pyelonephritis, nephrolithiasis, cystitis   Co morbidities that complicate the patient evaluation  See HPI   Additional history obtained:  Additional history obtained from EMR External  records from outside source obtained and reviewed including hospital records   Lab Tests:  I Ordered, and personally interpreted labs.  The pertinent results include: No leukocytosis.  No evidence of anemia.  Platelets within range.  No electrolyte abnormalities.  No renal dysfunction.  No transaminitis.  Isolated elevation of total bilirubin of 1.3.  UA without abnormality  Imaging Studies ordered:  I ordered imaging studies including CT abdomen pelvis I independently visualized and interpreted imaging which showed no acute intra-abdominal/pelvic abnormality.  Patient with evidence of lipomatous pancreas I agree with the radiologist interpretation   Cardiac Monitoring: / EKG:  The patient was maintained on a cardiac monitor.  I personally viewed and interpreted the cardiac  monitored which showed an underlying rhythm of: Sinus rhythm   Consultations Obtained:  N/a   Problem List / ED Course / Critical interventions / Medication management  Abdominal pain I ordered medication including Maalox, Pepcid   Reevaluation of the patient after these medicines showed that the patient stayed the same I have reviewed the patients home medicines and have made adjustments as needed   Social Determinants of Health:  Some cigarette use.  Denies illicit drug use.   Test / Admission - Considered:  Abdominal pain Vitals signs within normal range and stable throughout visit. Laboratory/imaging studies significant for: See above 22 year old male presents emergency department with 2 to 66-month long history of abdominal pain.  Laboratory studies relatively reassuring without obvious acute abnormality besides mildly elevated total bilirubin of which patient has had in the past and is following outpatient with gastroenterology for.  CT imaging was pursued given duration of patient's symptoms of abdominal pain of which was negative for any acute abnormality.  Unsure exact etiology of patient's abdominal pain we will trial Bentyl in outpatient setting as well as recommend follow-up with gastroenterology as patient has already established care but is yet to have a first appointment.  Patient tolerating p.o. without difficulty, well-appearing, afebrile in no acute distress.  Treatment plan discussed at length with patient and he acknowledged understanding was agreeable to said plan. Worrisome signs and symptoms were discussed with the patient, and the patient acknowledged understanding to return to the ED if noticed. Patient was stable upon discharge.          Final Clinical Impression(s) / ED Diagnoses Final diagnoses:  Abdominal pain, unspecified abdominal location    Rx / DC Orders ED Discharge Orders          Ordered    dicyclomine (BENTYL) 20 MG tablet  2 times  daily PRN        01/25/23 1504              Peter Garter, [place] 01/25/23 1715    Linwood Dibbles, MD 01/26/23 1557

## 2023-01-25 NOTE — ED Notes (Signed)
Patient verbalizes understanding of discharge instructions. Opportunity for questioning and answers were provided. Patient discharged from ED.  °

## 2023-01-25 NOTE — Discharge Instructions (Addendum)
As discussed, CT scan was reassuring.  Did show lipomatous changes to your pancreas without obvious inflammation or other abnormal finding.  Recommend trying Bentyl at home for your abdominal discomfort.  Keep upcoming appointment with gastroenterology for reassessment of your symptoms as well as following of your elevated bilirubin.  Please do not hesitate to return to emergency department for worrisome signs and symptoms we discussed to become apparent.

## 2023-02-03 ENCOUNTER — Emergency Department (HOSPITAL_BASED_OUTPATIENT_CLINIC_OR_DEPARTMENT_OTHER)
Admission: EM | Admit: 2023-02-03 | Discharge: 2023-02-03 | Disposition: A | Discharge status: Home / Self Care | Payer: Medicaid Other | Attending: Emergency Medicine | Admitting: Emergency Medicine

## 2023-02-03 ENCOUNTER — Encounter (HOSPITAL_BASED_OUTPATIENT_CLINIC_OR_DEPARTMENT_OTHER): Payer: Self-pay | Admitting: Emergency Medicine

## 2023-02-03 ENCOUNTER — Other Ambulatory Visit: Payer: Self-pay

## 2023-02-03 DIAGNOSIS — R1012 Left upper quadrant pain: Secondary | ICD-10-CM | POA: Insufficient documentation

## 2023-02-03 DIAGNOSIS — F17210 Nicotine dependence, cigarettes, uncomplicated: Secondary | ICD-10-CM | POA: Diagnosis not present

## 2023-02-03 LAB — COMPREHENSIVE METABOLIC PANEL
ALT: 12 U/L (ref 0–44)
AST: 14 U/L — ABNORMAL LOW (ref 15–41)
Albumin: 4 g/dL (ref 3.5–5.0)
Alkaline Phosphatase: 72 U/L (ref 38–126)
Anion gap: 7 (ref 5–15)
BUN: 11 mg/dL (ref 6–20)
CO2: 26 mmol/L (ref 22–32)
Calcium: 8.9 mg/dL (ref 8.9–10.3)
Chloride: 105 mmol/L (ref 98–111)
Creatinine, Ser: 0.79 mg/dL (ref 0.61–1.24)
GFR, Estimated: >60 mL/min (ref >60)
Glucose, Bld: 98 mg/dL (ref 70–99)
Potassium: 3.9 mmol/L (ref 3.5–5.1)
Sodium: 138 mmol/L (ref 135–145)
Total Bilirubin: 1.1 mg/dL (ref 0.3–1.2)
Total Protein: 6.3 g/dL — ABNORMAL LOW (ref 6.5–8.1)

## 2023-02-03 LAB — CBC
HCT: 39.1 % (ref 39.0–52.0)
Hemoglobin: 13.5 g/dL (ref 13.0–17.0)
MCH: 29.7 pg (ref 26.0–34.0)
MCHC: 34.5 g/dL (ref 30.0–36.0)
MCV: 86.1 fL (ref 80.0–100.0)
Platelets: 192 10*3/uL (ref 150–400)
RBC: 4.54 MIL/uL (ref 4.22–5.81)
RDW: 13.3 % (ref 11.5–15.5)
WBC: 6.1 10*3/uL (ref 4.0–10.5)
nRBC: 0 % (ref 0.0–0.2)

## 2023-02-03 LAB — LIPASE, BLOOD: Lipase: 17 U/L (ref 11–51)

## 2023-02-03 MED ORDER — SUCRALFATE 1 G PO TABS: 1.0000 g | ORAL_TABLET | Freq: Four times a day (QID) | ORAL | 0 refills | Status: AC | PRN

## 2023-02-03 MED ORDER — HALOPERIDOL LACTATE 5 MG/ML IJ SOLN
2.0000 mg | Freq: Once | INTRAMUSCULAR | Status: AC
  Administered 2023-02-03: 2 mg via INTRAVENOUS
  Filled 2023-02-03: qty 1

## 2023-02-03 MED ORDER — OMEPRAZOLE 20 MG PO CPDR: 20.0000 mg | DELAYED_RELEASE_CAPSULE | Freq: Every day | ORAL | 1 refills | Status: AC

## 2023-02-03 MED ORDER — FAMOTIDINE IN NACL 20-0.9 MG/50ML-% IV SOLN
20.0000 mg | Freq: Once | INTRAVENOUS | Status: AC
Start: 2023-02-03 — End: 2023-02-03
  Administered 2023-02-03: 20 mg via INTRAVENOUS
  Filled 2023-02-03: qty 50

## 2023-02-03 MED ORDER — SODIUM CHLORIDE 0.9 % IV BOLUS
1000.0000 mL | Freq: Once | INTRAVENOUS | Status: AC
  Administered 2023-02-03: 1000 mL via INTRAVENOUS

## 2023-02-03 NOTE — ED Triage Notes (Signed)
Pt c/o chronic abdominal pain. Has taken the bentyl that was prescribed with some relief. States that he has imaging on Monday and GI follow in 3-4 weeks after.

## 2023-02-03 NOTE — ED Provider Notes (Signed)
[facility] Emergency Department Provider Note MRN:  [id number]  Arrival date & time: 02/03/23     Chief Complaint   Abdominal Pain   History of Present Illness   Evan Small. is a 22 y.o. year-old male with no pertinent past medical history presenting to the ED with chief complaint of abdominal pain.  Left upper quadrant abdominal pain for the past week or so, was prescribed Bentyl and it helps a little bit but not very much.  Pain is worse this morning.  Nausea but no vomiting.  No diarrhea.  No chest pain or shortness of breath.  Review of Systems  A thorough review of systems was obtained and all systems are negative except as noted in the HPI and PMH.   Patient's Health History    Past Medical History:  Diagnosis Date   Migraines    Seasonal allergies     Past Surgical History:  Procedure Laterality Date   ANKLE SURGERY     WISDOM TOOTH EXTRACTION      Family History  Problem Relation Age of Onset   Heart failure Mother     Social History   Socioeconomic History   Marital status: Single    Spouse name: Not on file   Number of children: Not on file   Years of education: Not on file   Highest education level: Not on file  Occupational History   Not on file  Tobacco Use   Smoking status: Some Days    Types: Cigarettes   Smokeless tobacco: Never   Tobacco comments:    black n mild occ  Substance and Sexual Activity   Alcohol use: Yes    Comment: occ   Drug use: Yes    Types: Marijuana   Sexual activity: Not on file  Other Topics Concern   Not on file  Social History Narrative   Not on file   Social Determinants of Health   Financial Resource Strain: Not on file  Food Insecurity: Not on file  Transportation Needs: Not on file  Physical Activity: Not on file  Stress: Not on file  Social Connections: Not on file  Intimate Partner Violence: Not on file     Physical Exam   Vitals:   02/03/23 0524 02/03/23 0600  BP:  (!) 128/101 (!) 133/95  Pulse: (!) 49 68  Resp: 20 17  Temp: 98.5 F (36.9 C)   SpO2: 99% 94%    CONSTITUTIONAL: Well-appearing, NAD NEURO/PSYCH:  Alert and oriented x 3, no focal deficits EYES:  eyes equal and reactive ENT/NECK:  no LAD, no JVD CARDIO: Regular rate, well-perfused, normal S1 and S2 PULM:  CTAB no wheezing or rhonchi GI/GU:  non-distended, non-tender MSK/SPINE:  No gross deformities, no edema SKIN:  no rash, atraumatic   *Additional and/or pertinent findings included in MDM below  Diagnostic and Interventional Summary    EKG Interpretation  Date/Time:    Ventricular Rate:    PR Interval:    QRS Duration:   QT Interval:    QTC Calculation:   R Axis:     Text Interpretation:         Labs Reviewed  COMPREHENSIVE METABOLIC PANEL - Abnormal; Notable for the following components:      Result Value   Total Protein 6.3 (*)    AST 14 (*)    All other components within normal limits  CBC  LIPASE, BLOOD    No orders to display  Medications  sodium chloride 0.9 % bolus 1,000 mL (1,000 mLs Intravenous New Bag/Given 02/03/23 0549)  famotidine (PEPCID) IVPB 20 mg premix (20 mg Intravenous New Bag/Given 02/03/23 0552)  haloperidol lactate (HALDOL) injection 2 mg (2 mg Intravenous Given 02/03/23 1610)     Procedures  /  Critical Care Procedures  ED Course and Medical Decision Making  Initial Impression and Ddx Suspect GERD or gastritis given the location of pain.  Recent CT scan.  Had some mild T. bili elevation last week, recheck labs, provide symptomatic management and reassess.  Past medical/surgical history that increases complexity of ED encounter: None  Interpretation of Diagnostics I personally reviewed the laboratory assessment and my interpretation is as follows: No significant blood count or electrolyte disturbance    Patient Reassessment and Ultimate Disposition/Management     Patient feeling much better on reassessment, soft abdomen, normal  vitals.  I see no real indication for repeat imaging today, seems appropriate for trial of PPI and return precautions.  Patient management required discussion with the following services or consulting groups:  None  Complexity of Problems Addressed Acute illness or injury that poses threat of life of bodily function  Additional Data Reviewed and Analyzed Further history obtained from: Prior ED visit notes and Prior labs/imaging results  Additional Factors Impacting ED Encounter Risk Prescriptions  Elmer Sow. Pilar Plate, MD [facility] Emergency Medicine [facility] [email or web address]  Final Clinical Impressions(s) / ED Diagnoses     ICD-10-CM   1. LUQ abdominal pain  R10.12       ED Discharge Orders          Ordered    sucralfate (CARAFATE) 1 g tablet  4 times daily PRN        02/03/23 0646    omeprazole (PRILOSEC) 20 MG capsule  Daily        02/03/23 0646             Discharge Instructions Discussed with and Provided to Patient:     Discharge Instructions      You were evaluated in the Emergency Department and after careful evaluation, we did not find any emergent condition requiring admission or further testing in the hospital.  Your exam/testing today is overall reassuring.  Symptoms may be due to inflammation of the stomach lining.  Take the omeprazole medication daily to prevent pain.  Use the Carafate medication as needed for immediate relief.  Please return to the Emergency Department if you experience any worsening of your condition.   Thank you for allowing [place] to be a part of your care.       [name], MD 02/03/23 [phone]

## 2023-02-03 NOTE — Discharge Instructions (Signed)
You were evaluated in the Emergency Department and after careful evaluation, we did not find any emergent condition requiring admission or further testing in the hospital.  Your exam/testing today is overall reassuring.  Symptoms may be due to inflammation of the stomach lining.  Take the omeprazole medication daily to prevent pain.  Use the Carafate medication as needed for immediate relief.  Please return to the Emergency Department if you experience any worsening of your condition.   Thank you for allowing [place] to be a part of your care.

## 2023-09-12 ENCOUNTER — Encounter (HOSPITAL_BASED_OUTPATIENT_CLINIC_OR_DEPARTMENT_OTHER): Payer: Self-pay | Admitting: Internal Medicine

## 2023-10-06 ENCOUNTER — Encounter (HOSPITAL_BASED_OUTPATIENT_CLINIC_OR_DEPARTMENT_OTHER): Payer: Self-pay | Admitting: Internal Medicine

## 2023-10-06 DIAGNOSIS — G4700 Insomnia, unspecified: Secondary | ICD-10-CM

## 2023-10-06 DIAGNOSIS — R5383 Other fatigue: Secondary | ICD-10-CM

## 2023-12-08 ENCOUNTER — Ambulatory Visit (HOSPITAL_BASED_OUTPATIENT_CLINIC_OR_DEPARTMENT_OTHER): Payer: Medicaid Other | Attending: Physician Assistant | Admitting: Internal Medicine

## 2024-04-03 NOTE — Procedures (Signed)
 Evan Small

## 2024-04-03 NOTE — Procedures (Signed)
 Error

## 2024-06-16 ENCOUNTER — Encounter (HOSPITAL_COMMUNITY): Payer: Self-pay

## 2024-06-16 ENCOUNTER — Other Ambulatory Visit: Payer: Self-pay

## 2024-06-16 ENCOUNTER — Ambulatory Visit (HOSPITAL_COMMUNITY)
Admission: RE | Admit: 2024-06-16 | Discharge: 2024-06-16 | Disposition: A | Discharge status: Home / Self Care | Payer: Self-pay | Source: Ambulatory Visit | Attending: Internal Medicine | Admitting: Internal Medicine

## 2024-06-16 VITALS — BP 114/67 | HR 87 | Temp 98.8°F | Resp 18

## 2024-06-16 DIAGNOSIS — W503XXA Accidental bite by another person, initial encounter: Secondary | ICD-10-CM | POA: Diagnosis not present

## 2024-06-16 DIAGNOSIS — S01352A Open bite of left ear, initial encounter: Secondary | ICD-10-CM | POA: Diagnosis not present

## 2024-06-16 MED ORDER — DOXYCYCLINE HYCLATE 100 MG PO CAPS: 100.0000 mg | ORAL_CAPSULE | Freq: Two times a day (BID) | ORAL | 0 refills | Status: AC

## 2024-06-16 MED ORDER — METRONIDAZOLE 500 MG PO TABS: 500.0000 mg | ORAL_TABLET | Freq: Two times a day (BID) | ORAL | 0 refills | Status: AC

## 2024-06-16 MED ORDER — MUPIROCIN 2 % EX OINT: 1.0000 | TOPICAL_OINTMENT | Freq: Two times a day (BID) | CUTANEOUS | 1 refills | Status: AC

## 2024-06-16 MED ORDER — IBUPROFEN 800 MG PO TABS: 800.0000 mg | ORAL_TABLET | Freq: Three times a day (TID) | ORAL | 0 refills | Status: AC | PRN
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
